# Patient Record
Sex: Female | Born: 1945 | Race: Black or African American | Hispanic: No | Smoking: Former smoker
Health system: Southern US, Community
[De-identification: ages and names within clinical notes are randomized; demographics above are authoritative.]

## PROBLEM LIST (undated history)

## (undated) DIAGNOSIS — E7800 Pure hypercholesterolemia, unspecified: Secondary | ICD-10-CM

## (undated) DIAGNOSIS — E119 Type 2 diabetes mellitus without complications: Secondary | ICD-10-CM

## (undated) DIAGNOSIS — I10 Essential (primary) hypertension: Secondary | ICD-10-CM

## (undated) DIAGNOSIS — H0520 Unspecified exophthalmos: Secondary | ICD-10-CM

## (undated) HISTORY — DX: Unspecified exophthalmos: H05.20

---

## 2003-01-05 ENCOUNTER — Encounter: Payer: Self-pay | Admitting: Emergency Medicine

## 2003-01-05 ENCOUNTER — Inpatient Hospital Stay (HOSPITAL_COMMUNITY): Admission: EM | Admit: 2003-01-05 | Discharge: 2003-01-10 | Payer: Self-pay | Admitting: Emergency Medicine

## 2003-01-06 ENCOUNTER — Encounter: Payer: Self-pay | Admitting: Internal Medicine

## 2003-01-07 ENCOUNTER — Encounter: Payer: Self-pay | Admitting: Internal Medicine

## 2016-06-10 DIAGNOSIS — I5031 Acute diastolic (congestive) heart failure: Secondary | ICD-10-CM | POA: Diagnosis present

## 2016-06-10 DIAGNOSIS — J449 Chronic obstructive pulmonary disease, unspecified: Secondary | ICD-10-CM | POA: Insufficient documentation

## 2016-06-11 DIAGNOSIS — D589 Hereditary hemolytic anemia, unspecified: Secondary | ICD-10-CM | POA: Diagnosis present

## 2016-06-11 DIAGNOSIS — J9611 Chronic respiratory failure with hypoxia: Secondary | ICD-10-CM | POA: Diagnosis present

## 2016-07-13 ENCOUNTER — Encounter (HOSPITAL_COMMUNITY): Payer: Self-pay

## 2016-07-13 ENCOUNTER — Emergency Department (HOSPITAL_COMMUNITY): Payer: Medicare Other

## 2016-07-13 ENCOUNTER — Inpatient Hospital Stay (HOSPITAL_COMMUNITY)
Admission: EM | Admit: 2016-07-13 | Discharge: 2016-07-16 | DRG: 377 | Disposition: A | Discharge status: Home / Self Care | Payer: Medicare Other | Attending: Internal Medicine | Admitting: Internal Medicine

## 2016-07-13 DIAGNOSIS — K922 Gastrointestinal hemorrhage, unspecified: Secondary | ICD-10-CM | POA: Diagnosis not present

## 2016-07-13 DIAGNOSIS — M341 CR(E)ST syndrome: Secondary | ICD-10-CM | POA: Diagnosis present

## 2016-07-13 DIAGNOSIS — J9611 Chronic respiratory failure with hypoxia: Secondary | ICD-10-CM | POA: Diagnosis present

## 2016-07-13 DIAGNOSIS — D589 Hereditary hemolytic anemia, unspecified: Secondary | ICD-10-CM | POA: Diagnosis present

## 2016-07-13 DIAGNOSIS — I5023 Acute on chronic systolic (congestive) heart failure: Secondary | ICD-10-CM

## 2016-07-13 DIAGNOSIS — I130 Hypertensive heart and chronic kidney disease with heart failure and stage 1 through stage 4 chronic kidney disease, or unspecified chronic kidney disease: Secondary | ICD-10-CM | POA: Diagnosis present

## 2016-07-13 DIAGNOSIS — N183 Chronic kidney disease, stage 3 (moderate): Secondary | ICD-10-CM | POA: Diagnosis present

## 2016-07-13 DIAGNOSIS — T39015A Adverse effect of aspirin, initial encounter: Secondary | ICD-10-CM | POA: Diagnosis present

## 2016-07-13 DIAGNOSIS — E119 Type 2 diabetes mellitus without complications: Secondary | ICD-10-CM

## 2016-07-13 DIAGNOSIS — K921 Melena: Principal | ICD-10-CM | POA: Diagnosis present

## 2016-07-13 DIAGNOSIS — E1122 Type 2 diabetes mellitus with diabetic chronic kidney disease: Secondary | ICD-10-CM | POA: Diagnosis present

## 2016-07-13 DIAGNOSIS — D649 Anemia, unspecified: Secondary | ICD-10-CM

## 2016-07-13 DIAGNOSIS — E669 Obesity, unspecified: Secondary | ICD-10-CM | POA: Diagnosis present

## 2016-07-13 DIAGNOSIS — Z87891 Personal history of nicotine dependence: Secondary | ICD-10-CM

## 2016-07-13 DIAGNOSIS — M329 Systemic lupus erythematosus, unspecified: Secondary | ICD-10-CM | POA: Diagnosis present

## 2016-07-13 DIAGNOSIS — J9621 Acute and chronic respiratory failure with hypoxia: Secondary | ICD-10-CM | POA: Diagnosis present

## 2016-07-13 DIAGNOSIS — I10 Essential (primary) hypertension: Secondary | ICD-10-CM | POA: Diagnosis present

## 2016-07-13 DIAGNOSIS — R768 Other specified abnormal immunological findings in serum: Secondary | ICD-10-CM | POA: Diagnosis present

## 2016-07-13 DIAGNOSIS — D539 Nutritional anemia, unspecified: Secondary | ICD-10-CM | POA: Diagnosis present

## 2016-07-13 DIAGNOSIS — K2970 Gastritis, unspecified, without bleeding: Secondary | ICD-10-CM | POA: Diagnosis present

## 2016-07-13 DIAGNOSIS — E872 Acidosis: Secondary | ICD-10-CM | POA: Diagnosis present

## 2016-07-13 DIAGNOSIS — I2721 Secondary pulmonary arterial hypertension: Secondary | ICD-10-CM | POA: Diagnosis present

## 2016-07-13 DIAGNOSIS — I5031 Acute diastolic (congestive) heart failure: Secondary | ICD-10-CM | POA: Diagnosis present

## 2016-07-13 DIAGNOSIS — I5082 Biventricular heart failure: Secondary | ICD-10-CM | POA: Diagnosis present

## 2016-07-13 DIAGNOSIS — Z6830 Body mass index (BMI) 30.0-30.9, adult: Secondary | ICD-10-CM

## 2016-07-13 DIAGNOSIS — I272 Pulmonary hypertension, unspecified: Secondary | ICD-10-CM

## 2016-07-13 DIAGNOSIS — I509 Heart failure, unspecified: Secondary | ICD-10-CM

## 2016-07-13 DIAGNOSIS — I50813 Acute on chronic right heart failure: Secondary | ICD-10-CM

## 2016-07-13 DIAGNOSIS — E7800 Pure hypercholesterolemia, unspecified: Secondary | ICD-10-CM | POA: Diagnosis present

## 2016-07-13 DIAGNOSIS — J449 Chronic obstructive pulmonary disease, unspecified: Secondary | ICD-10-CM | POA: Diagnosis present

## 2016-07-13 HISTORY — DX: Type 2 diabetes mellitus without complications: E11.9

## 2016-07-13 HISTORY — DX: Essential (primary) hypertension: I10

## 2016-07-13 HISTORY — DX: Pure hypercholesterolemia, unspecified: E78.00

## 2016-07-13 LAB — CBC WITH DIFFERENTIAL/PLATELET
BASOS ABS: 0 10*3/uL (ref 0.0–0.1)
BASOS PCT: 0 %
Eosinophils Absolute: 0.1 10*3/uL (ref 0.0–0.7)
Eosinophils Relative: 1 %
HEMATOCRIT: 21.6 % — AB (ref 36.0–46.0)
HEMOGLOBIN: 6.6 g/dL — AB (ref 12.0–15.0)
Lymphocytes Relative: 9 %
Lymphs Abs: 0.8 10*3/uL (ref 0.7–4.0)
MCH: 27.5 pg (ref 26.0–34.0)
MCHC: 30.6 g/dL (ref 30.0–36.0)
MCV: 90 fL (ref 78.0–100.0)
MONO ABS: 0.3 10*3/uL (ref 0.1–1.0)
Monocytes Relative: 3 %
NEUTROS ABS: 7.9 10*3/uL — AB (ref 1.7–7.7)
NEUTROS PCT: 87 %
Platelets: 307 10*3/uL (ref 150–400)
RBC: 2.4 MIL/uL — AB (ref 3.87–5.11)
RDW: 19.5 % — AB (ref 11.5–15.5)
WBC: 9.1 10*3/uL (ref 4.0–10.5)

## 2016-07-13 LAB — COMPREHENSIVE METABOLIC PANEL
ALBUMIN: 3.8 g/dL (ref 3.5–5.0)
ALT: 30 U/L (ref 14–54)
AST: 25 U/L (ref 15–41)
Alkaline Phosphatase: 56 U/L (ref 38–126)
Anion gap: 7 (ref 5–15)
BILIRUBIN TOTAL: 1.1 mg/dL (ref 0.3–1.2)
BUN: 14 mg/dL (ref 6–20)
CO2: 26 mmol/L (ref 22–32)
Calcium: 9.5 mg/dL (ref 8.9–10.3)
Chloride: 104 mmol/L (ref 101–111)
Creatinine, Ser: 1.13 mg/dL — ABNORMAL HIGH (ref 0.44–1.00)
GFR calc Af Amer: 56 mL/min — ABNORMAL LOW (ref >60)
GFR calc non Af Amer: 48 mL/min — ABNORMAL LOW (ref >60)
GLUCOSE: 163 mg/dL — AB (ref 65–99)
POTASSIUM: 4.2 mmol/L (ref 3.5–5.1)
Sodium: 137 mmol/L (ref 135–145)
TOTAL PROTEIN: 6.8 g/dL (ref 6.5–8.1)

## 2016-07-13 LAB — CBC
HCT: 29.6 % — ABNORMAL LOW (ref 36.0–46.0)
Hemoglobin: 9.5 g/dL — ABNORMAL LOW (ref 12.0–15.0)
MCH: 28.4 pg (ref 26.0–34.0)
MCHC: 32.1 g/dL (ref 30.0–36.0)
MCV: 88.6 fL (ref 78.0–100.0)
PLATELETS: 226 10*3/uL (ref 150–400)
RBC: 3.34 MIL/uL — AB (ref 3.87–5.11)
RDW: 18.4 % — ABNORMAL HIGH (ref 11.5–15.5)
WBC: 8.2 10*3/uL (ref 4.0–10.5)

## 2016-07-13 LAB — I-STAT ARTERIAL BLOOD GAS, ED
Bicarbonate: 27.1 mmol/L (ref 20.0–28.0)
O2 SAT: 95 %
TCO2: 29 mmol/L (ref 0–100)
pCO2 arterial: 55.6 mmHg — ABNORMAL HIGH (ref 32.0–48.0)
pH, Arterial: 7.295 — ABNORMAL LOW (ref 7.350–7.450)
pO2, Arterial: 86 mmHg (ref 83.0–108.0)

## 2016-07-13 LAB — I-STAT TROPONIN, ED: Troponin i, poc: 0.01 ng/mL (ref 0.00–0.08)

## 2016-07-13 LAB — GLUCOSE, CAPILLARY
Glucose-Capillary: 90 mg/dL (ref 65–99)
Glucose-Capillary: 88 mg/dL (ref 65–99)

## 2016-07-13 LAB — PREPARE RBC (CROSSMATCH)

## 2016-07-13 LAB — MRSA PCR SCREENING: MRSA by PCR: NEGATIVE

## 2016-07-13 LAB — BRAIN NATRIURETIC PEPTIDE: B Natriuretic Peptide: 971.3 pg/mL — ABNORMAL HIGH (ref 0.0–100.0)

## 2016-07-13 LAB — POC OCCULT BLOOD, ED: Fecal Occult Bld: POSITIVE — AB

## 2016-07-13 LAB — ABO/RH: ABO/RH(D): A POS

## 2016-07-13 MED ORDER — ORAL CARE MOUTH RINSE
15.0000 mL | Freq: Two times a day (BID) | OROMUCOSAL | Status: DC
  Administered 2016-07-14 – 2016-07-16 (×5): 15 mL via OROMUCOSAL

## 2016-07-13 MED ORDER — CHLORHEXIDINE GLUCONATE 0.12 % MT SOLN
15.0000 mL | Freq: Two times a day (BID) | OROMUCOSAL | Status: DC
  Administered 2016-07-13 – 2016-07-16 (×6): 15 mL via OROMUCOSAL
  Filled 2016-07-13 (×5): qty 15

## 2016-07-13 MED ORDER — AMLODIPINE BESYLATE 10 MG PO TABS
10.0000 mg | ORAL_TABLET | Freq: Every day | ORAL | Status: DC
  Administered 2016-07-14 – 2016-07-16 (×3): 10 mg via ORAL
  Filled 2016-07-13 (×3): qty 1

## 2016-07-13 MED ORDER — ACETAMINOPHEN 650 MG RE SUPP: 650.0000 mg | Freq: Four times a day (QID) | RECTAL | Status: DC | PRN

## 2016-07-13 MED ORDER — SODIUM CHLORIDE 0.9% FLUSH
3.0000 mL | Freq: Two times a day (BID) | INTRAVENOUS | Status: DC
  Administered 2016-07-14 – 2016-07-16 (×5): 3 mL via INTRAVENOUS

## 2016-07-13 MED ORDER — ATORVASTATIN CALCIUM 80 MG PO TABS
80.0000 mg | ORAL_TABLET | Freq: Every day | ORAL | Status: DC
  Administered 2016-07-14 – 2016-07-16 (×2): 80 mg via ORAL
  Filled 2016-07-13 (×2): qty 1

## 2016-07-13 MED ORDER — SODIUM CHLORIDE 0.9 % IV SOLN
Freq: Once | INTRAVENOUS | Status: AC
  Administered 2016-07-13: 10 mL/h via INTRAVENOUS

## 2016-07-13 MED ORDER — INSULIN ASPART 100 UNIT/ML ~~LOC~~ SOLN
0.0000 [IU] | Freq: Three times a day (TID) | SUBCUTANEOUS | Status: DC
  Administered 2016-07-14: 1 [IU] via SUBCUTANEOUS
  Administered 2016-07-14 – 2016-07-16 (×3): 2 [IU] via SUBCUTANEOUS

## 2016-07-13 MED ORDER — INSULIN ASPART 100 UNIT/ML ~~LOC~~ SOLN: 0.0000 [IU] | Freq: Every day | SUBCUTANEOUS | Status: DC

## 2016-07-13 MED ORDER — FUROSEMIDE 10 MG/ML IJ SOLN
20.0000 mg | Freq: Two times a day (BID) | INTRAMUSCULAR | Status: AC
  Administered 2016-07-13 – 2016-07-14 (×2): 20 mg via INTRAVENOUS
  Filled 2016-07-13 (×2): qty 2

## 2016-07-13 MED ORDER — FUROSEMIDE 10 MG/ML IJ SOLN
40.0000 mg | Freq: Once | INTRAMUSCULAR | Status: AC
  Administered 2016-07-13: 40 mg via INTRAVENOUS
  Filled 2016-07-13: qty 4

## 2016-07-13 MED ORDER — ACETAMINOPHEN 325 MG PO TABS: 650.0000 mg | ORAL_TABLET | Freq: Four times a day (QID) | ORAL | Status: DC | PRN

## 2016-07-13 MED ORDER — SENNOSIDES-DOCUSATE SODIUM 8.6-50 MG PO TABS: 1.0000 | ORAL_TABLET | Freq: Every evening | ORAL | Status: DC | PRN

## 2016-07-13 MED ORDER — SODIUM CHLORIDE 0.9 % IV SOLN
80.0000 mg | Freq: Once | INTRAVENOUS | Status: AC
  Administered 2016-07-13: 80 mg via INTRAVENOUS
  Filled 2016-07-13: qty 80

## 2016-07-13 MED ORDER — HYDRALAZINE HCL 20 MG/ML IJ SOLN: 5.0000 mg | Freq: Four times a day (QID) | INTRAMUSCULAR | Status: DC | PRN

## 2016-07-13 MED ORDER — SODIUM CHLORIDE 0.9 % IV SOLN
8.0000 mg/h | INTRAVENOUS | Status: DC
  Administered 2016-07-13 (×2): 8 mg/h via INTRAVENOUS
  Filled 2016-07-13 (×6): qty 80

## 2016-07-13 NOTE — ED Notes (Signed)
CRITICAL VALUE ALERT  Critical value received: Hgb 6.6  Date of notification:  07/13/2016  Time of notification:  0901  Critical value read back:Yes   Nurse who received alert: Earleen Newport RN  MD notified: Lita Mains MD

## 2016-07-13 NOTE — Progress Notes (Signed)
Pt transported from ED to 4N12 on 6 L [place]. Once patient settled in room RT placed back on BIPAP due to increased WOB. Pt seems to be breathing better on BIPAP and stated that it "feels so much better". Vital signs stable. Will cont to monitor.

## 2016-07-13 NOTE — ED Notes (Signed)
Hospitalist at bedside.

## 2016-07-13 NOTE — ED Notes (Signed)
RT at bedside placing applying Bipap.

## 2016-07-13 NOTE — ED Provider Notes (Signed)
[facility] DEPT Provider Note   CSN: [id number] Arrival date & time: 07/13/16  0755     History   Chief Complaint Chief Complaint  Patient presents with  . Shortness of Breath    HPI Kathleen Becker is a 70 y.o. female.  HPI Patient presents with increased shortness of breath starting last night. She's recently admitted for CHF exacerbation and discharged home with 2 L of supplement oxygen. She denies any pain at this point including chest pain. No new lower extremity swelling. No cough, fever or chills. Past Medical History:  Diagnosis Date  . Diabetes mellitus without complication ([place])   . High cholesterol   . Hypertension     Patient Active Problem List   Diagnosis Date Noted  . CHF exacerbation ([place]) 07/13/2016    History reviewed. No pertinent surgical history.  OB History    No data available       Home Medications    Prior to Admission medications   Not on File    Family History No family history on file.  Social History Social History  Substance Use Topics  . Smoking status: Never Smoker  . Smokeless tobacco: Not on file  . Alcohol use Not on file     Allergies   Patient has no allergy information on record.   Review of Systems Review of Systems  Constitutional: Negative for chills and fever.  Respiratory: Positive for shortness of breath. Negative for cough.   Cardiovascular: Negative for chest pain, palpitations and leg swelling.  Gastrointestinal: Negative for abdominal pain, diarrhea, nausea and vomiting.  Musculoskeletal: Negative for back pain, myalgias, neck pain and neck stiffness.  Skin: Negative for rash and wound.  Neurological: Negative for dizziness, weakness, light-headedness, numbness and headaches.  All other systems reviewed and are negative.    Physical Exam Updated Vital Signs BP 161/73   Pulse 75   Temp 98.8 F (37.1 C) (Axillary)   Resp 25   Ht _0  (1.6 m)   Wt 170 lb (77.1 kg)   SpO2 100%   BMI  30.11 kg/m   Physical Exam  Constitutional: She is oriented to person, place, and time. She appears well-developed and well-nourished.  HENT:  Head: Normocephalic and atraumatic.  Mouth/Throat: Oropharynx is clear and moist. No oropharyngeal exudate.  Eyes: EOM are normal. Pupils are equal, round, and reactive to light.  Neck: Normal range of motion. Neck supple.  Cardiovascular: Normal rate and regular rhythm.  Exam reveals no gallop and no friction rub.   No murmur heard. Pulmonary/Chest:  Diminished breath sounds bilateral bases. Patient with increased work of breathing. Speaking in few word sentences.  Abdominal: Soft. Bowel sounds are normal. There is no tenderness. There is no rebound and no guarding.  Musculoskeletal: Normal range of motion. She exhibits no edema or tenderness.  No lower extremity swelling, asymmetry or tenderness.  Neurological: She is alert and oriented to person, place, and time.  Moves all extremities without deficit. Sensation is fully intact.  Skin: Skin is warm and dry. Capillary refill takes less than 2 seconds. No rash noted. No erythema.  Psychiatric: She has a normal mood and affect. Her behavior is normal.  Nursing note and vitals reviewed.    ED Treatments / Results  Labs (all labs ordered are listed, but only abnormal results are displayed) Labs Reviewed  CBC WITH DIFFERENTIAL/PLATELET - Abnormal; Notable for the following:       Result Value   RBC 2.40 (*)  Hemoglobin 6.6 (*)    HCT 21.6 (*)    RDW 19.5 (*)    Neutro Abs 7.9 (*)    All other components within normal limits  COMPREHENSIVE METABOLIC PANEL - Abnormal; Notable for the following:    Glucose, Bld 163 (*)    Creatinine, Ser 1.13 (*)    GFR calc non Af Amer 48 (*)    GFR calc Af Amer 56 (*)    All other components within normal limits  BRAIN NATRIURETIC PEPTIDE - Abnormal; Notable for the following:    B Natriuretic Peptide 971.3 (*)    All other components within normal  limits  I-STAT ARTERIAL BLOOD GAS, ED - Abnormal; Notable for the following:    pH, Arterial 7.295 (*)    pCO2 arterial 55.6 (*)    All other components within normal limits  POC OCCULT BLOOD, ED - Abnormal; Notable for the following:    Fecal Occult Bld POSITIVE (*)    All other components within normal limits  I-STAT TROPOININ, ED  TYPE AND SCREEN  PREPARE RBC (CROSSMATCH)  ABO/RH    EKG  EKG Interpretation  Date/Time:  Tuesday July 13 2016 08:01:07 EST Ventricular Rate:  79 PR Interval:  134 QRS Duration: 64 QT Interval:  350 QTC Calculation: 401 R Axis:   51 Text Interpretation:  Normal sinus rhythm Normal ECG Confirmed by Lita Mains  MD, Davanta Meuser ([id number]) on 07/13/2016 11:37:52 AM       Radiology Dg Chest Port 1 View  Result Date: 07/13/2016 CLINICAL DATA:  Two days of shortness of breath EXAM: PORTABLE CHEST 1 VIEW COMPARISON:  Report of a chest x-ray of Jan 07, 2003 FINDINGS: The lungs are adequately inflated. There is bibasilar increased density. Pleural effusions are present greatest on the right. The cardiac silhouette is enlarged. The pulmonary vascularity is engorged. There is calcification in the wall of the aortic arch. The observed bony thorax is unremarkable. IMPRESSION: CHF with pulmonary interstitial edema and bilateral pleural effusions greatest on the right. Bibasilar atelectasis or pneumonia is suspected as well. Thoracic aortic atherosclerosis. Electronically Signed   By: Railyn House  [place] M.D.   On: 07/13/2016 09:33    Procedures Procedures (including critical care time)  Medications Ordered in ED Medications  pantoprazole (PROTONIX) 80 mg in sodium chloride 0.9 % 100 mL IVPB (not administered)  pantoprazole (PROTONIX) 80 mg in sodium chloride 0.9 % 250 mL (0.32 mg/mL) infusion (not administered)  0.9 %  sodium chloride infusion (10 mL/hr Intravenous New Bag/Given 07/13/16 1042)  furosemide (LASIX) injection 40 mg (40 mg Intravenous Given 07/13/16 1029)     CRITICAL CARE Performed by: Lita Mains, Pessy Delamar Total critical care time: 35 minutes Critical care time was exclusive of separately billable procedures and treating other patients. Critical care was necessary to treat or prevent imminent or life-threatening deterioration. Critical care was time spent personally by me on the following activities: development of treatment plan with patient and/or surrogate as well as nursing, discussions with consultants, evaluation of patient's response to treatment, examination of patient, obtaining history from patient or surrogate, ordering and performing treatments and interventions, ordering and review of laboratory studies, ordering and review of radiographic studies, pulse oximetry and re-evaluation of patient's condition.  Initial Impression / Assessment and Plan / ED Course  I have reviewed the triage vital signs and the nursing notes.  Pertinent labs & imaging results that were available during my care of the patient were reviewed by me and considered in my medical decision making (  see chart for details).  Clinical Course    Patient was placed on BiPAP with improved oxygenation. X-ray with bilateral effusions and congestion. Question atelectasis versus infiltrate. Patient is afebrile with a normal white count. Low suspicion for pneumonia. Also noted to be anemic with hemoglobin of 6.6. Admits to melanotic stools recently. Guaiac-positive. Started on Protonix. Discussed with gastroenterology who will see patient in consult. Internal medicine teaching service will admit to step down bed.   Final Clinical Impressions(s) / ED Diagnoses   Final diagnoses:  Acute on chronic congestive heart failure, unspecified congestive heart failure type (HCC)  Symptomatic anemia  Acute gastrointestinal bleeding    New Prescriptions New Prescriptions   No medications on file     Julianne Rice, MD 07/13/16 1138

## 2016-07-13 NOTE — Consult Note (Signed)
Referring Provider:  Dr. Johnnette Gourd (Med Teaching Service) Primary Care Physician:  No primary care provider on file. Primary Gastroenterologist:  None (unassigned--was seen for colonoscopy many years ago by unknown practitioner)  Reason for Consultation:  Heme positive stool and anemia  HPI: Kathleen Becker is a 70 y.o. female been admitted through the emergency room because of acute on chronic shortness of breath.  She is 3 weeks status post hospitalization at [facility] for similar symptoms, with a perhaps 1 month prodrome of significant exertional dyspnea, in association with a chronic, nonproductive cough, as well as leg swelling. The patient has a minimal smoking history, just a couple of cigarettes a day, and in fact stopped that several years ago.   Although the patient was initially thought to have congestive heart failure, apparently that diagnosis was ruled out (records from that hospitalization are not currently available for review). Apparently she even had a cardiac catheterization, as well as echocardiograms.   It was felt, according to the patient and her daughter, that her problem is most likely a primary pulmonary condition and an outpatient pulmonologist referral was made, but she has not yet seen that physician. She was sent home on home oxygen around the clock, 2 L/m, and her breathing improved to the point where she was quite comfortable and even able to walk moderate distances without shortness of breath.  However, last night and this morning, she was acutely short of breath and was brought to the hospital, where she was found to be severely anemic with a hemoglobin of 6.6 (MCV 90, RDW elevated at 19.5, BUN normal). Her hemoglobin during her recent hospitalization in [place] is not known, but neither the patient nor her daughter were told of any anemia being present during that hospitalization.   In the ER here, she was occultly heme positive and her stool was  dark in character, according to the emergency room physician. The patient herself noted dark, melenic stools about a month ago but did not notice any recently.   She does not have significant localizing GI tract symptoms although she says for the past year or so, her appetite has been diminished and she's had perhaps a 15 pound weight loss, unintentional.  The patient specifically denies dysphagia, nausea, reflux symptomatology, epigastric pain, constipation, diarrhea, or overt rectal bleeding.  She has been on 81 mg aspirin daily for many years, without PPI coverage. She has no prior history of GI bleeding.  She indicates that approximately 2004, she underwent upper endoscopy and colonoscopy at [facility], apparently because of heme-positive stool and anemia at that time, apparently without any significant findings being uncovered. She does not remember who did that exam.   Past Medical History:  Diagnosis Date  . Diabetes mellitus without complication ([place])   . High cholesterol   . Hypertension     History reviewed. No pertinent surgical history.  Prior to Admission medications   Not on File    Current Facility-Administered Medications  Medication Dose Route Frequency Provider Last Rate Last Dose  . acetaminophen (TYLENOL) tablet 650 mg  650 mg Oral Q6H PRN Milagros Loll, MD       Or  . acetaminophen (TYLENOL) suppository 650 mg  650 mg Rectal Q6H PRN Milagros Loll, MD      . amLODipine (NORVASC) tablet 10 mg  10 mg Oral Daily Milagros Loll, MD      . atorvastatin (LIPITOR) tablet 80 mg  80 mg Oral q1800  Milagros Loll, MD      . furosemide (LASIX) injection 20 mg  20 mg Intravenous BID Milagros Loll, MD      . insulin aspart (novoLOG) injection 0-5 Units  0-5 Units Subcutaneous QHS Milagros Loll, MD      . insulin aspart (novoLOG) injection 0-9 Units  0-9 Units Subcutaneous TID WC Milagros Loll, MD      . pantoprazole (PROTONIX) 80 mg in sodium  chloride 0.9 % 250 mL (0.32 mg/mL) infusion  8 mg/hr Intravenous Continuous Julianne Rice, MD 25 mL/hr at 07/13/16 1324 8 mg/hr at 07/13/16 1324  . senna-docusate (Senokot-S) tablet 1 tablet  1 tablet Oral QHS PRN Milagros Loll, MD      . sodium chloride flush (NS) 0.9 % injection 3 mL  3 mL Intravenous Q12H Milagros Loll, MD       No current outpatient prescriptions on file.    Allergies as of 07/13/2016  . (Not on File)    No family history on file.  Social History   Social History  . Marital status: Divorced    Spouse name: N/A  . Number of children: N/A  . Years of education: N/A   Occupational History  . Not on file.   Social History Main Topics  . Smoking status: Never Smoker  . Smokeless tobacco: Not on file  . Alcohol use Not on file  . Drug use: Unknown  . Sexual activity: Not on file   Other Topics Concern  . Not on file   Social History Narrative  . No narrative on file    Review of Systems:   Recent dry cough and shortness of breath as per history of present illness. No chest pain. Abdominal and GI symptoms as per history of present illness. No neurologic symptomatology such as paresthesias or localized weakness. No hematuria. No joint swelling or effusions. No enlargement of lymph nodes. No skin rashes.  Physical Exam: Vital signs in last 24 hours: Temp:  [97.5 F (36.4 C)-98.8 F (37.1 C)] 98.8 F (37.1 C) (11/14 1126) Pulse Rate:  [68-80] 71 (11/14 1400) Resp:  [17-29] 25 (11/14 1400) BP: (135-176)/(54-88) 155/88 (11/14 1400) SpO2:  [73 %-100 %] 100 % (11/14 1400) Weight:  [77.1 kg (170 lb)] 77.1 kg (170 lb) (11/14 0802)   General:   Alert,  Well-developed, well-nourished, pleasant and cooperative in NAD, on BiPAP device Head:  Normocephalic and atraumatic. Eyes:  Sclera clear, no icterus.   Conjunctiva pale, s/p 1 u prc's. Mouth:   No ulcerations or lesions.   Neck:   No masses or thyromegaly. Lungs:  No rales or wheezes. Perhaps a  few soft rhonchi. Vesicular breath sounds seem to be somewhat decreased. There is no overt prolongation of expiration. Heart:   Regular rate and rhythm; no murmurs, clicks, rubs,  or gallops. Abdomen:  Soft, nontender, nontympanitic, and nondistended although somewhat opiates. No masses, hepatosplenomegaly or ventral hernias noted.Quiet bowel sounds, without bruits, guarding, or rebound.   Rectal:  Per EDP, showed dark heme-positive stool Msk:   Symmetrical without gross deformities. Pulses:  Normal radial pulse is noted. Extremities:   Without clubbing, cyanosis, or edema. Specifically, no pitting tibial edema at this time. Neurologic:  Alert and coherent;  grossly normal neurologically. Skin:  Intact without significant lesions or rashes. Cervical Nodes:  No significant cervical adenopathy. Psych:   Alert and cooperative. Normal mood and affect.  Intake/Output from previous day: No intake/output data recorded. Intake/Output this  shift: Total I/O In: -  Out: 450 [Urine:450]  Lab Results:  Recent Labs  07/13/16 0829  WBC 9.1  HGB 6.6*  HCT 21.6*  PLT 307   BMET  Recent Labs  07/13/16 0829  NA 137  K 4.2  CL 104  CO2 26  GLUCOSE 163*  BUN 14  CREATININE 1.13*  CALCIUM 9.5   LFT  Recent Labs  07/13/16 0829  PROT 6.8  ALBUMIN 3.8  AST 25  ALT 30  ALKPHOS 56  BILITOT 1.1   PT/INR No results for input(s): LABPROT, INR in the last 72 hours.  Studies/Results: Dg Chest Port 1 View  Result Date: 07/13/2016 CLINICAL DATA:  Two days of shortness of breath EXAM: PORTABLE CHEST 1 VIEW COMPARISON:  Report of a chest x-ray of Jan 07, 2003 FINDINGS: The lungs are adequately inflated. There is bibasilar increased density. Pleural effusions are present greatest on the right. The cardiac silhouette is enlarged. The pulmonary vascularity is engorged. There is calcification in the wall of the aortic arch. The observed bony thorax is unremarkable. IMPRESSION: CHF with  pulmonary interstitial edema and bilateral pleural effusions greatest on the right. Bibasilar atelectasis or pneumonia is suspected as well. Thoracic aortic atherosclerosis. Electronically Signed   By: David  [place] M.D.   On: 07/13/2016 09:33    Impression: 1. Anemia, normocytic,? Subacute 2. Heme positive stool, dark stool, recent melenic stool per patient 3. Aspirin exposure, without PPI coverage 4. Recent hospitalization for shortness of breath and leg swelling, with apparent suspicion of pulmonary causation, workup pending. Apparently no history of heart failure based on cardiac catheterization and echo at outside hospital.  5. Remote history (2004) of endoscopy and colonoscopy, results unavailable, apparently no major findings 6. Medical problems including diabetes, hypertension, dyslipidemia  Plan: Patient will definitely need endoscopy, colonoscopy, and possibly capsule endoscopy, ideally on this admission given the instability with which she presented. I have discussed the nature, purpose, risks, and alternatives of endoscopy and colonoscopy with the patient and her daughter, Hilda Blades, at the bedside.  However, the patient's pulmonary status is not currently sufficient to do nonemergent endoscopic/colonoscopic evaluation. Following blood transfusion, with improvement in her hemoglobin, this may improve to the point where she will be ready to have the examinations, but I will defer to Pulmonary on that issue.   I would favor pulmonary consultation during this hospitalization to assist with patient's evaluation and to help guide optimal timing of endoscopic and colonoscopic testing.  We will plan to follow with you, but in the meantime, do not hesitate to call if you would like to discuss her case.   LOS: 0 days   Emeri Estill V  07/13/2016, 2:30 PM   Pager [phone] If no answer or after 5 PM call [phone]

## 2016-07-13 NOTE — Progress Notes (Signed)
Pt placed on BIPAP per MD order. Pt tol very well. Seems to be breathing easier. Vital signs stable. Will cont to monitor

## 2016-07-13 NOTE — ED Notes (Signed)
Respiratory called to put pt on Bipap, per [facility].

## 2016-07-13 NOTE — H&P (Signed)
Date: 07/13/2016               Patient Name:  Kathleen Becker MRN: [id number]  DOB: July 07, 1946 Age / Sex: 70 y.o., female   PCP: No primary care provider on file.         Medical Service: Internal Medicine Teaching Service         Attending Physician: Dr. Lucious Groves, DO    First Contact: Dr. Jari Favre Pager: [phone]  Second Contact: Dr. Tiburcio Pea Pager: [phone]       After Hours (After 5p/  First Contact Pager: [phone]  weekends / holidays): Second Contact Pager: [phone]   Chief Complaint: shortness of breath  History of Present Illness: 70 year old woman with hsitory of DM2, HTN, HLD, severe pulmonary HTN presenting with dyspnea x 1 day. She had some mild dyspnea the day before yesterday which resolved after she used her incentive spirometer. Last night she was lying in bed when she became acute dyspneic. She tried to sit up with no relief. She felt a little bit better after she drank some cold water. She denies chest pain. She has mild LE edema. No fevers, chills, cough.  She has noted that she has been having melena. She denies nausea, vomiting, abdominal pain, constipation, diarrhea, hematochezia. She has not had any unintentional weight loss but does note decreased appetite. She denies dysphagia or odynophagia. Denies dysuria or hematuria. She is on ASA 22m daily otherwise no NSAIDs. No blood thinners.  Recently admitted 10/12 with dyspnea. LHC on 10/19 with no cardiac disease noted. She has severe pulmonary hypertension with PA pressure of 70. She was discharged on 10/20 with 2L supplemental oxygen.  She notes that she was admitted 7 years ago for pneumonia and had melena. She had EGD and colonoscopy done at the time. The only finding was a single polyp which was benign according to patient.  Meds:  No outpatient prescriptions have been marked as taking [facility]     Allergies: Allergies as of 07/13/2016  . (Not on File)   Past  Medical History:  Diagnosis Date  . Diabetes mellitus without complication ([place]   . High cholesterol   . Hypertension     Family History:  Mother had renal disease Sister had uterine cancer  Social History:  She quit tobacco 5 years ago and smoked 1-2 cigarettes a day for 30 years. Occasional alcohol use Denies illicits.  Review of Systems: A complete ROS was negative except as per HPI.   Physical Exam: Blood pressure 161/73, pulse 75, temperature 98.8 F (37.1 C), temperature source Axillary, resp. rate 25, height _0  (1.6 m), weight 170 lb (77.1 kg), SpO2 100 %. General Apperance: NAD Head: Normocephalic, atraumatic Eyes: PERRL, EOMI, anicteric sclera Ears: Normal external ear canal Nose: Nares normal, septum midline, mucosa normal Throat: Lips, mucosa and tongue normal  Neck: Supple, trachea midline Back: No tenderness or bony abnormality  Lungs: Clear to auscultation bilaterally. No wheezes, rhonchi or rales. Breathing comfortably Chest Wall: Nontender, no deformity Heart: Regular rate and rhythm, no murmur/rub/gallop Abdomen: Soft, nontender, nondistended, no rebound/guarding Extremities: Normal, atraumatic, warm and well perfused, no edema Pulses: 2+ throughout Skin: No rashes or lesions Neurologic: Alert and oriented x 3. CNII-XII intact. Normal strength and sensation  EKG: normal sinus rhythm, T wave inversion in III, no previous EKG for comparison  CXR: Enlarged cardiac silhouette with pulmonary interstitial edema and bilateral pleural effusions greatest on the right.  Assessment & Plan by Problem: 70 year old woman with history of DM2, HTN, HLD, severe pulmonary HTN presenting with dyspnea.  Acute on Chronic Right Ventricular Failure, Severe Pulmonary Hypertension: Dyspnea x 1 day. Recently diagnosed with severe pulmonary HTN on cath. BNP 971.3 today. BNP on discharge from previous hospitalization 157. Echo on 10/16 with LV EF 07-86%, diastolic dysfunction.  EKG with no acute ischemic changes and troponin 0.01. CXR with pulmonary interstitial edema and bilateral pleural effusions. ABG with respiratory acidosis with pH 7.295 and pCO2 55.6. Last ABG on 10/12 with pH 7.3 and pCO2 53. D-dimer on 10/12 657. VQ scan on 10/16 with low probability for PE. She was given 17m IV Lasix and placed on BiPAP in the ED. -Admit to step down unit -Wean off BiPAP -Strict in/out, daily weight -Continue diuresis. Given her pulmonary hypertension will continue with gentle diuresis as she is preload dependent. Lasix 250mBID for now.  Acute on Chronic Anemia: Hgb on admission 6.6 Last hgb 9.2 on 10/19. She is FOBT positive. Last endoscopy was over 7 years ago. She was started on protonix gtt and given 2 units pRBC. GI consulted by ED. -Await GI recs -Consider changing PPI gtt to pantoprazole 4043mID depending on GI recs -NPO -Hold aspirin. She is on this for primary prevention as her cath recently had no coronary artery disease. -Repeat CBC at 8PM  CKD3: Creatinine 1.13. Last cr 1.25 on 10/19. GFR 50.4.   DM2: Hgb A1c 6 on 8/31 -Hold home metformin -SSI  HTN: Continue home amlodipine 71m80mily  HLD: Continue home Lipitor 80mg24mly.  FEN: NPO VTE ppx: SCDs Code status: FULL  Dispo: Admit patient to Observation with expected length of stay less than 2 midnights.  Signed: JenniMilagros Loll11/14/2017, 11:46 AM  Pager: [phone]

## 2016-07-13 NOTE — ED Triage Notes (Signed)
Patient complains of increasing shortness of breath x 1 day with mild cough. Denies CP. Was recently hospitalized for same and no diagnosis made. Oxygen sats 73 on 2l on arrival. Alert and oriented, denies pain

## 2016-07-13 NOTE — Progress Notes (Signed)
Paged Internal Med about BP, orders to give hydralazine for Syst>200, will continue to monitor closely.

## 2016-07-14 ENCOUNTER — Encounter (HOSPITAL_COMMUNITY): Payer: Self-pay | Admitting: *Deleted

## 2016-07-14 DIAGNOSIS — J9621 Acute and chronic respiratory failure with hypoxia: Secondary | ICD-10-CM | POA: Diagnosis not present

## 2016-07-14 DIAGNOSIS — I5033 Acute on chronic diastolic (congestive) heart failure: Secondary | ICD-10-CM | POA: Diagnosis not present

## 2016-07-14 DIAGNOSIS — I10 Essential (primary) hypertension: Secondary | ICD-10-CM | POA: Diagnosis present

## 2016-07-14 DIAGNOSIS — E1122 Type 2 diabetes mellitus with diabetic chronic kidney disease: Secondary | ICD-10-CM

## 2016-07-14 DIAGNOSIS — M329 Systemic lupus erythematosus, unspecified: Secondary | ICD-10-CM | POA: Diagnosis present

## 2016-07-14 DIAGNOSIS — Z7982 Long term (current) use of aspirin: Secondary | ICD-10-CM

## 2016-07-14 DIAGNOSIS — T39015A Adverse effect of aspirin, initial encounter: Secondary | ICD-10-CM | POA: Diagnosis present

## 2016-07-14 DIAGNOSIS — E872 Acidosis: Secondary | ICD-10-CM | POA: Diagnosis present

## 2016-07-14 DIAGNOSIS — I272 Pulmonary hypertension, unspecified: Secondary | ICD-10-CM | POA: Diagnosis not present

## 2016-07-14 DIAGNOSIS — Z789 Other specified health status: Secondary | ICD-10-CM | POA: Insufficient documentation

## 2016-07-14 DIAGNOSIS — I2729 Other secondary pulmonary hypertension: Secondary | ICD-10-CM | POA: Diagnosis not present

## 2016-07-14 DIAGNOSIS — E785 Hyperlipidemia, unspecified: Secondary | ICD-10-CM

## 2016-07-14 DIAGNOSIS — J449 Chronic obstructive pulmonary disease, unspecified: Secondary | ICD-10-CM | POA: Diagnosis present

## 2016-07-14 DIAGNOSIS — D539 Nutritional anemia, unspecified: Secondary | ICD-10-CM | POA: Diagnosis present

## 2016-07-14 DIAGNOSIS — Z79899 Other long term (current) drug therapy: Secondary | ICD-10-CM

## 2016-07-14 DIAGNOSIS — H409 Unspecified glaucoma: Secondary | ICD-10-CM | POA: Insufficient documentation

## 2016-07-14 DIAGNOSIS — E119 Type 2 diabetes mellitus without complications: Secondary | ICD-10-CM

## 2016-07-14 DIAGNOSIS — Z6830 Body mass index (BMI) 30.0-30.9, adult: Secondary | ICD-10-CM

## 2016-07-14 DIAGNOSIS — D588 Other specified hereditary hemolytic anemias: Secondary | ICD-10-CM | POA: Diagnosis not present

## 2016-07-14 DIAGNOSIS — E7800 Pure hypercholesterolemia, unspecified: Secondary | ICD-10-CM | POA: Diagnosis present

## 2016-07-14 DIAGNOSIS — I130 Hypertensive heart and chronic kidney disease with heart failure and stage 1 through stage 4 chronic kidney disease, or unspecified chronic kidney disease: Secondary | ICD-10-CM | POA: Diagnosis not present

## 2016-07-14 DIAGNOSIS — N183 Chronic kidney disease, stage 3 (moderate): Secondary | ICD-10-CM | POA: Diagnosis present

## 2016-07-14 DIAGNOSIS — E669 Obesity, unspecified: Secondary | ICD-10-CM | POA: Diagnosis present

## 2016-07-14 DIAGNOSIS — K2970 Gastritis, unspecified, without bleeding: Secondary | ICD-10-CM | POA: Diagnosis present

## 2016-07-14 DIAGNOSIS — I2721 Secondary pulmonary arterial hypertension: Secondary | ICD-10-CM | POA: Diagnosis present

## 2016-07-14 DIAGNOSIS — Z7984 Long term (current) use of oral hypoglycemic drugs: Secondary | ICD-10-CM

## 2016-07-14 DIAGNOSIS — Z86010 Personal history of colonic polyps: Secondary | ICD-10-CM

## 2016-07-14 DIAGNOSIS — Z841 Family history of disorders of kidney and ureter: Secondary | ICD-10-CM

## 2016-07-14 DIAGNOSIS — M341 CR(E)ST syndrome: Secondary | ICD-10-CM | POA: Diagnosis present

## 2016-07-14 DIAGNOSIS — Z87891 Personal history of nicotine dependence: Secondary | ICD-10-CM | POA: Diagnosis not present

## 2016-07-14 DIAGNOSIS — D589 Hereditary hemolytic anemia, unspecified: Secondary | ICD-10-CM | POA: Diagnosis present

## 2016-07-14 DIAGNOSIS — I5082 Biventricular heart failure: Secondary | ICD-10-CM | POA: Diagnosis present

## 2016-07-14 DIAGNOSIS — I5031 Acute diastolic (congestive) heart failure: Secondary | ICD-10-CM | POA: Diagnosis not present

## 2016-07-14 DIAGNOSIS — D649 Anemia, unspecified: Secondary | ICD-10-CM

## 2016-07-14 DIAGNOSIS — K921 Melena: Secondary | ICD-10-CM | POA: Diagnosis present

## 2016-07-14 DIAGNOSIS — R768 Other specified abnormal immunological findings in serum: Secondary | ICD-10-CM | POA: Diagnosis present

## 2016-07-14 DIAGNOSIS — E782 Mixed hyperlipidemia: Secondary | ICD-10-CM | POA: Insufficient documentation

## 2016-07-14 DIAGNOSIS — Z8049 Family history of malignant neoplasm of other genital organs: Secondary | ICD-10-CM

## 2016-07-14 DIAGNOSIS — K922 Gastrointestinal hemorrhage, unspecified: Secondary | ICD-10-CM | POA: Diagnosis present

## 2016-07-14 LAB — CBC
HCT: 29.4 % — ABNORMAL LOW (ref 36.0–46.0)
HEMOGLOBIN: 9.2 g/dL — AB (ref 12.0–15.0)
MCH: 27.8 pg (ref 26.0–34.0)
MCHC: 31.3 g/dL (ref 30.0–36.0)
MCV: 88.8 fL (ref 78.0–100.0)
Platelets: 222 10*3/uL (ref 150–400)
RBC: 3.31 MIL/uL — ABNORMAL LOW (ref 3.87–5.11)
RDW: 18.4 % — AB (ref 11.5–15.5)
WBC: 8.4 10*3/uL (ref 4.0–10.5)

## 2016-07-14 LAB — TECHNOLOGIST SMEAR REVIEW

## 2016-07-14 LAB — BASIC METABOLIC PANEL
ANION GAP: 7 (ref 5–15)
BUN: 14 mg/dL (ref 6–20)
CALCIUM: 9 mg/dL (ref 8.9–10.3)
CO2: 29 mmol/L (ref 22–32)
Chloride: 104 mmol/L (ref 101–111)
Creatinine, Ser: 1.26 mg/dL — ABNORMAL HIGH (ref 0.44–1.00)
GFR calc Af Amer: 49 mL/min — ABNORMAL LOW (ref >60)
GFR, EST NON AFRICAN AMERICAN: 42 mL/min — AB (ref >60)
GLUCOSE: 144 mg/dL — AB (ref 65–99)
POTASSIUM: 3.6 mmol/L (ref 3.5–5.1)
SODIUM: 140 mmol/L (ref 135–145)

## 2016-07-14 LAB — PULMONARY FUNCTION TEST
FEF 25-75 POST: 0.63 L/s
FEF 25-75 Pre: 0.3 L/sec
FEF2575-%CHANGE-POST: 109 %
FEF2575-%PRED-POST: 39 %
FEF2575-%Pred-Pre: 18 %
FEV1-%CHANGE-POST: 23 %
FEV1-%Pred-Post: 37 %
FEV1-%Pred-Pre: 30 %
FEV1-POST: 0.64 L
FEV1-Pre: 0.52 L
FEV1FVC-%CHANGE-POST: 11 %
FEV1FVC-%PRED-PRE: 87 %
FEV6-%Change-Post: 10 %
FEV6-%PRED-POST: 40 %
FEV6-%Pred-Pre: 36 %
FEV6-Post: 0.86 L
FEV6-Pre: 0.77 L
FEV6FVC-%CHANGE-POST: 0 %
FEV6FVC-%Pred-Post: 104 %
FEV6FVC-%Pred-Pre: 104 %
FVC-%Change-Post: 10 %
FVC-%PRED-POST: 38 %
FVC-%PRED-PRE: 34 %
FVC-PRE: 0.77 L
FVC-Post: 0.86 L
POST FEV1/FVC RATIO: 75 %
PRE FEV6/FVC RATIO: 100 %
Post FEV6/FVC ratio: 100 %
Pre FEV1/FVC ratio: 67 %

## 2016-07-14 LAB — DIRECT ANTIGLOBULIN TEST (NOT AT ARMC)
DAT, COMPLEMENT: NEGATIVE
DAT, IgG: NEGATIVE

## 2016-07-14 LAB — TYPE AND SCREEN
ABO/RH(D): A POS
ANTIBODY SCREEN: NEGATIVE
UNIT DIVISION: 0
UNIT DIVISION: 0

## 2016-07-14 LAB — LACTATE DEHYDROGENASE: LDH: 238 U/L — AB (ref 98–192)

## 2016-07-14 LAB — GLUCOSE, CAPILLARY
GLUCOSE-CAPILLARY: 98 mg/dL (ref 65–99)
GLUCOSE-CAPILLARY: 206 mg/dL — AB (ref 65–99)
Glucose-Capillary: 176 mg/dL — ABNORMAL HIGH (ref 65–99)
Glucose-Capillary: 136 mg/dL — ABNORMAL HIGH (ref 65–99)
Glucose-Capillary: 102 mg/dL — ABNORMAL HIGH (ref 65–99)

## 2016-07-14 LAB — RETICULOCYTES
RBC.: 3.45 MIL/uL — ABNORMAL LOW (ref 3.87–5.11)
RETIC COUNT ABSOLUTE: 138 10*3/uL (ref 19.0–186.0)
RETIC CT PCT: 4 % — AB (ref 0.4–3.1)

## 2016-07-14 MED ORDER — PANTOPRAZOLE SODIUM 40 MG PO TBEC
40.0000 mg | DELAYED_RELEASE_TABLET | Freq: Two times a day (BID) | ORAL | Status: DC
  Administered 2016-07-14 – 2016-07-16 (×6): 40 mg via ORAL
  Filled 2016-07-14 (×6): qty 1

## 2016-07-14 MED ORDER — FUROSEMIDE 10 MG/ML IJ SOLN
20.0000 mg | Freq: Two times a day (BID) | INTRAMUSCULAR | Status: AC
  Administered 2016-07-14 – 2016-07-15 (×2): 20 mg via INTRAVENOUS
  Filled 2016-07-14 (×2): qty 2

## 2016-07-14 MED ORDER — SUCRALFATE 1 GM/10ML PO SUSP
1.0000 g | Freq: Three times a day (TID) | ORAL | Status: DC
  Administered 2016-07-14 – 2016-07-16 (×9): 1 g via ORAL
  Filled 2016-07-14 (×11): qty 10

## 2016-07-14 MED ORDER — DORZOLAMIDE HCL-TIMOLOL MAL 2-0.5 % OP SOLN
1.0000 [drp] | Freq: Two times a day (BID) | OPHTHALMIC | Status: DC
  Administered 2016-07-15 – 2016-07-16 (×2): 1 [drp] via OPHTHALMIC
  Filled 2016-07-14 (×2): qty 10

## 2016-07-14 MED ORDER — ATENOLOL 50 MG PO TABS
50.0000 mg | ORAL_TABLET | Freq: Every day | ORAL | Status: DC
  Administered 2016-07-14 – 2016-07-16 (×3): 50 mg via ORAL
  Filled 2016-07-14 (×3): qty 1

## 2016-07-14 NOTE — Progress Notes (Signed)
Off BiPAP. On nasal O2. Breathing comfortably, no evident distress.  Hemoglobin 9.2 following transfusion, stable overnight. BUN remains normal, implying absence of active upper tract bleeding.  Patient is off aspirin and on pantoprazole twice a day, plus sucralfate 4 times a day.  Dr. Agustina Caroli note (pulmonary medicine) reviewed, and case discussed with Dr. Jari Favre of the internal medicine house staff.  Although I was unaware of that when I did my consultation yesterday, it is now noted that this patient has pulmonary hypertension, as confirmed by very high pulmonary arterial pressures on cardiac catheterization last month in [place]. In addition, there may be an element of significant intrinsic lung disease, which is in the process of being evaluated.  Recommendations:  1. In light of the above information, I now feel (in contrast to my initial consult note yesterday) that this patient would be best served by holding off on endoscopy and colonoscopy, at least for the time being. We will wait and see what the pulmonary function testing and the remainder of the pulmonary workup shows [place].  2. Reasoning for holding off includes the increased anesthesia risk, coupled with the relatively poor tolerance that smokers often have for instrumentation of the upper GI tract. Moreover, the patient is not showing signs of active bleeding at present, so our hand is not forced to do endoscopy.  3. Instead, I would favor aggressive antipeptic therapy as is currently being done, and monitoring the patient for signs of progressive blood loss.  4. Specifically, I would recommend twice daily Protonix indefinitely, and sucralfate 4 times a day for at least the next week.  5. I would also begin iron supplementation, keeping in mind that that will probably give her dark stools.  6. Concerning aspirin, it would be ideal if the patient could remain off it for at least the next week, to allow any ulcer that may be  present to have a chance to begin to heal. Thereafter, if aspirin is felt to be strongly indicated, it would probably be okay to resume it as long as the patient had ongoing PPI prophylaxis.  7. The patient will need fairly frequent, close follow-up after discharge, with monitoring of her hemoglobin level, and ideally Hemoccult testing as well.  This could be accomplished through her primary care physician's office. Since she has recently (? Temporarily?) Relocated to this area, she may need or want to establish with the PCP here in [place].  8. As long as the patient remains stable from the GI bleeding standpoint, my intent would be to see her in my office about a month from now. Depending on what her hemoglobin is doing in response to iron supplementation, it might be sufficient simply to do serial Hemoccults, with no further GI testing if she is repeatedly Hemoccult negative and her hemoglobin improves appropriately.   9. On the other hand, if she remains Hemoccult positive and/or her hemoglobin fails to improve with iron supplementation, she might need evaluation of the GI tract, which could probably be accomplished most safely by radiographic means (upper GI series and barium enema).  10. If the patient shows evidence of active or subacute bleeding, as defined by fairly rapid drops in her hemoglobin, I would probably attempt outpatient upper endoscopy with minimal conscious sedation using a pediatric endoscope at the hospital endoscopy unit.  11. We will follow at a distance during the remainder of her hospitalization. In the meantime, please feel free to call me if earlier input is needed, or if you  have any questions, or would like to discuss her case.  Cleotis Nipper, M.D. Pager [phone] If no answer or after 5 PM call [phone]

## 2016-07-14 NOTE — Progress Notes (Signed)
Subjective:  Patient states she is doing much better today though still gets short of breath with even short-term ambulation. Otherwise, denies chest pain, cough, abd pain, or further stools. Denies urinary symptoms including hematuria.  Objective:  Vital signs in last 24 hours: Vitals:   07/14/16 0600 07/14/16 0800 07/14/16 1000 07/14/16 1220  BP: (!) 157/74 (!) 171/71 (!) 166/68 (!) 142/60  Pulse: 74 82 79   Resp: (!) 32 (!) 21 (!) 25   Temp:  98.6 F (37 C)  99.3 F (37.4 C)  TempSrc:  Oral  Oral  SpO2: 94% 98% 90%   Weight: 168 lb 6.9 oz (76.4 kg)     Height:       Constitutional: NAD, sitting up comfortably in bed, VS reviewed CV: RRR, systolic murmur best heard on RUSB, no rubs or gallops appreciated Resp: mild bibasilar crackles bilaterally, no wheezing or increased work of breathing Abd: soft, NDNT, +BS MSK: moves all ext freely  Assessment/Plan:  Active Problems:   CHF exacerbation (HCC)  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 Exam reveals bibasilar crackles and minimal LE edema. Pulmonology has been consulted and they are working up secondary causes for her [place]. We will keep monitoring fluid status as her PAH is pre-load dependent. --pulmonology consulted - appreciate their assistance --continue gentle diuresis; lasix 60m BID x 2 doses  Symptomatic Anemia: Patient presented with shortness of breath, history of melena, and acute Hgb drop to 6.5. She is s/p 2U PRBC's and her Hgb is maintaining at 9. Spoke with Dr. BRomilda Garretregarding pulmonary status and pulm recommendations - since patient is not having active bleeding and Hgb remains stable, would favor treating with PPI and sucralfate for now while PAH is worked up and managed. If patient begins actively bleeding, or significant drop in Hgb occurs, would begin workup (if hemodynamically stable) with upper GI series and  barium enema, and EGD/Colo if significant bleeding suspected.  --f/u CBC --Protonix 463mBID, sucralfate --continue holding asa   CKD3:  Stable, Cr 1.26 this AM --f/u AM BMP   DM2: Hgb A1c 6 on 8/31 -Hold home metformin -SSI  HTN:  Continue home amlodipine 1035maily  HLD:  Continue home Lipitor 28m25mily  Dispo: Anticipated discharge in approximately 1-2 day(s).   GoriAlphonzo Grieve 07/14/2016, 1:24 PM Pager [phone]

## 2016-07-14 NOTE — Consult Note (Signed)
Name: Kathleen Becker MRN: [id number] DOB: 02-16-1946    ADMISSION DATE:  07/13/2016 CONSULTATION DATE:  11/15  REFERRING MD :  Heber [place]  CHIEF COMPLAINT: Shortness of Breath   BRIEF PATIENT DESCRIPTION:   70 year old female with PMH of HTN, HLD, Type 2 Diabetes, CHF, current smoker (about 2 packs per week), severe pulmonary HTN presents to ED on 11/14 with complaints of dyspnea. Over the last couple of days as been able to control these symptoms with use of her incentive spirometer, however on 11/13 while lying in bed became acutely dyspneic and was unable to find relief. Upon arrival to ED was found to be anemic with a hemoglobin of 6.6 with a positive heme test. The patient reported that a month ago had dark stools but has not noticed any recently.    Recent hospitalization for dyspnea from 10/12-10/20 at [facility] where she underwent a Left Heart Cath on 10/19 that showed no cardiac disease but new diagnosis of Pulmonary HTN with PA pressure of 70, placed on atenolol 50 mg daily. Upon discharge was sent home on 2L [place] and a follow appointment with pulmonology.   Due to current pulmonary status, GI has requested a consult to pulmonology before undergoing invasive procedures to assist in optimizing timing of endoscopy and colonoscopy.   SIGNIFICANT EVENTS  10/12-10/20: Hospitalization at [facility] for dyspnea  11/14: Admit for dyspnea and acute anemia   STUDIES:  Echo 10/13 > EF 35-32% with diastolic dysfunction, LVH. Normal RV size and fxn, slightly dilated RA, moderate TR CXR 11/14 > CHF with pulmonary interstitial edema and bilateral pleural effusions greatest on the right  PAST MEDICAL HISTORY :   has a past medical history of Diabetes mellitus without complication ([place]); High cholesterol; and Hypertension.  has no past surgical history on file. Prior to Admission medications   Medication Sig Start Date End Date Taking? Authorizing Provider   albuterol (PROVENTIL HFA;VENTOLIN HFA) 108 (90 Base) MCG/ACT inhaler Inhale 1-2 puffs into the lungs every 6 (six) hours as needed for wheezing or shortness of breath.   Yes Historical Provider, MD  amLODipine (NORVASC) 10 MG tablet Take 10 mg by mouth daily.   Yes Historical Provider, MD  aspirin EC 81 MG tablet Take 81 mg by mouth daily.   Yes Historical Provider, MD  atenolol (TENORMIN) 50 MG tablet Take 50 mg by mouth daily.   Yes Historical Provider, MD  atorvastatin (LIPITOR) 80 MG tablet Take 80 mg by mouth daily.   Yes Historical Provider, MD  dorzolamide-timolol (COSOPT) 22.3-6.8 MG/ML ophthalmic solution Place 1 drop into both eyes 2 (two) times daily.   Yes Historical Provider, MD  glucose blood test strip 1 each by Other route as needed for other (blood glucose). Use as instructed   Yes Historical Provider, MD  metFORMIN (GLUCOPHAGE) 500 MG tablet Take 500 mg by mouth 2 (two) times daily with a meal.   Yes Historical Provider, MD   Allergies  Allergen Reactions  . Ace Inhibitors Swelling  . Lisinopril Swelling    FAMILY HISTORY:  family history is not on file. SOCIAL HISTORY:  reports that she has never smoked. She has never used smokeless tobacco.  REVIEW OF SYSTEMS: Positive in bold   Constitutional: Negative for fever, chills, weight loss, malaise/fatigue and diaphoresis.  HENT: Negative for hearing loss, ear pain, nosebleeds, congestion, sore throat, neck pain, tinnitus and ear discharge.   Eyes: Negative for blurred vision, double vision, photophobia, pain, discharge  and redness.  Respiratory: Negative for cough, hemoptysis, sputum production, shortness of breath, wheezing and stridor.   Cardiovascular: Negative for chest pain, palpitations, orthopnea, claudication, leg swelling and PND.  Gastrointestinal: Negative for heartburn, nausea, vomiting, abdominal pain, diarrhea, constipation, blood in stool and melena.  Genitourinary: Negative for dysuria, urgency, frequency,  hematuria and flank pain.  Musculoskeletal: Negative for myalgias, back pain, joint pain and falls.  Skin: Negative for itching and rash.  Neurological: Negative for dizziness, tingling, tremors, sensory change, speech change, focal weakness, seizures, loss of consciousness, weakness and headaches.  Endo/Heme/Allergies: Negative for environmental allergies and polydipsia. Does not bruise/bleed easily.  SUBJECTIVE:  No acute distress. Resting comfortably   VITAL SIGNS: Temp:  [97.4 F (36.3 C)-99.3 F (37.4 C)] 99.3 F (37.4 C) (11/15 1220) Pulse Rate:  [65-87] 79 (11/15 1000) Resp:  [16-32] 25 (11/15 1000) BP: (114-188)/(55-119) 142/60 (11/15 1220) SpO2:  [90 %-100 %] 90 % (11/15 1000) FiO2 (%):  [45 %] 45 % (11/14 2200) Weight:  [74.3 kg (163 lb 12.8 oz)-76.4 kg (168 lb 6.9 oz)] 76.4 kg (168 lb 6.9 oz) (11/15 0600)  PHYSICAL EXAMINATION: General: Elderly female resting comfortably in bed Neuro:  Alert and oriented x 4, fully intact  HEENT:  NAD  Cardiovascular:  No MRG, s1/s2  Lungs:  On 5 L [place], diminshed breath sounds to bases  Abdomen:  Active bowel sounds, non-tender  Musculoskeletal:  No deformities  Skin:  Intact, dry, clean    Recent Labs Lab 07/13/16 0829 07/14/16 0207  NA 137 140  K 4.2 3.6  CL 104 104  CO2 26 29  BUN 14 14  CREATININE 1.13* 1.26*  GLUCOSE 163* 144*    Recent Labs Lab 07/13/16 0829 07/13/16 2021 07/14/16 0207  HGB 6.6* 9.5* 9.2*  HCT 21.6* 29.6* 29.4*  WBC 9.1 8.2 8.4  PLT 307 226 222   Dg Chest Port 1 View  Result Date: 07/13/2016 CLINICAL DATA:  Two days of shortness of breath EXAM: PORTABLE CHEST 1 VIEW COMPARISON:  Report of a chest x-ray of Jan 07, 2003 FINDINGS: The lungs are adequately inflated. There is bibasilar increased density. Pleural effusions are present greatest on the right. The cardiac silhouette is enlarged. The pulmonary vascularity is engorged. There is calcification in the wall of the aortic arch. The observed  bony thorax is unremarkable. IMPRESSION: CHF with pulmonary interstitial edema and bilateral pleural effusions greatest on the right. Bibasilar atelectasis or pneumonia is suspected as well. Thoracic aortic atherosclerosis. Electronically Signed   By: David  Martinique M.D.   On: 07/13/2016 09:33    ASSESSMENT / PLAN:  Pulmonary Artery HTN possibly related to systemic HTN and dCHF, however considering PA pressure of 70 could be related to other factors that we would want to rule out.  H/O: Recent Hospitalization for dyspnea with Left Heart Cath showing no cardiac disease, however a PA pressure of 70, new atenolol and 2L oxygen requirement at home  -Supplemental oxygen to keep saturation >88 -Continue Atenolol  -Diuresis as BP and renal function tolerate -Strict I & O  -Will need Outpatient Pulm follow up   Certainly at some increased risk for pulmonary complications. Would continue diureses and use minimum anesthesia necessary.    Hayden Pedro, AG-ACNP [facility]  Pgr: [phone]  PCCM Pgr: [phone]   Attending Note:  I have examined patient, reviewed labs, studies and notes. I have discussed the case with Beaulah Corin, and I agree with the data and plans  as amended above.   70 yo woman with MMP including HTN and diastolic dysfxn, DM, tobacco abuse with presumed COPD. She had a recent hospitalization for dyspnea that prompted L and R heart cath that were significant for PASP 70, no reported CAD. Her PASP improved to ~50 after diuresis and systemic BP control. She was found to be hypoxemic and was started on home O2. V/q was low prob at [facility]. She is now admitted here with symptomatic anemia in setting GIB. We are asked to eval her dyspnea, secondary PAH, risks for EGD to work up the GIB.   Suspect that she does have significant COPD based on her hypoxemia, tobacco hx and secondary PAH. Other potential contributors to her PAH include systemic HTN,  possible OSA. Should also consider and rule out auto-immune disease, HIV.  She did take diet pills over 30 years ago - she cannot recall which one. We will help guide the workup. Most important will be to quantify and address her presumed COPD, avoid hypoxemia.   With regard to her pending EGD, she is at significantly increased risk for a procedure under general anesthesia, moderately increased risk for conscious sedation. I do not believe there is any absolute barrier to a procedure. We will plan to get PFT, auto-immune labs. This can be done in parallel with the GI workup.     Baltazar Apo, MD, PhD 07/14/2016, 2:55 PM  Pulmonary and Critical Care [phone] or if no answer [phone]

## 2016-07-15 ENCOUNTER — Other Ambulatory Visit: Payer: Self-pay | Admitting: Internal Medicine

## 2016-07-15 DIAGNOSIS — D62 Acute posthemorrhagic anemia: Secondary | ICD-10-CM

## 2016-07-15 DIAGNOSIS — I5023 Acute on chronic systolic (congestive) heart failure: Secondary | ICD-10-CM

## 2016-07-15 DIAGNOSIS — I50813 Acute on chronic right heart failure: Secondary | ICD-10-CM

## 2016-07-15 DIAGNOSIS — Z9889 Other specified postprocedural states: Secondary | ICD-10-CM

## 2016-07-15 DIAGNOSIS — D649 Anemia, unspecified: Secondary | ICD-10-CM

## 2016-07-15 DIAGNOSIS — I5033 Acute on chronic diastolic (congestive) heart failure: Secondary | ICD-10-CM

## 2016-07-15 DIAGNOSIS — I5031 Acute diastolic (congestive) heart failure: Secondary | ICD-10-CM

## 2016-07-15 DIAGNOSIS — I272 Pulmonary hypertension, unspecified: Secondary | ICD-10-CM

## 2016-07-15 DIAGNOSIS — J9621 Acute and chronic respiratory failure with hypoxia: Secondary | ICD-10-CM

## 2016-07-15 LAB — BASIC METABOLIC PANEL
Anion gap: 8 (ref 5–15)
BUN: 13 mg/dL (ref 6–20)
CO2: 31 mmol/L (ref 22–32)
Calcium: 9.2 mg/dL (ref 8.9–10.3)
Chloride: 101 mmol/L (ref 101–111)
Creatinine, Ser: 1.34 mg/dL — ABNORMAL HIGH (ref 0.44–1.00)
GFR calc Af Amer: 45 mL/min — ABNORMAL LOW (ref >60)
GFR calc non Af Amer: 39 mL/min — ABNORMAL LOW (ref >60)
Glucose, Bld: 104 mg/dL — ABNORMAL HIGH (ref 65–99)
Potassium: 3.6 mmol/L (ref 3.5–5.1)
Sodium: 140 mmol/L (ref 135–145)

## 2016-07-15 LAB — ANCA TITERS

## 2016-07-15 LAB — GLUCOSE, CAPILLARY
Glucose-Capillary: 111 mg/dL — ABNORMAL HIGH (ref 65–99)
Glucose-Capillary: 143 mg/dL — ABNORMAL HIGH (ref 65–99)
Glucose-Capillary: 128 mg/dL — ABNORMAL HIGH (ref 65–99)
Glucose-Capillary: 177 mg/dL — ABNORMAL HIGH (ref 65–99)

## 2016-07-15 LAB — ANTI-DNA ANTIBODY, DOUBLE-STRANDED: ds DNA Ab: <1 IU/mL (ref 0–9)

## 2016-07-15 LAB — RHEUMATOID FACTOR: RHEUMATOID FACTOR: 10.8 [IU]/mL (ref 0.0–13.9)

## 2016-07-15 LAB — CBC
HCT: 30.1 % — ABNORMAL LOW (ref 36.0–46.0)
Hemoglobin: 9.4 g/dL — ABNORMAL LOW (ref 12.0–15.0)
MCH: 28.1 pg (ref 26.0–34.0)
MCHC: 31.2 g/dL (ref 30.0–36.0)
MCV: 89.9 fL (ref 78.0–100.0)
Platelets: 209 10*3/uL (ref 150–400)
RBC: 3.35 MIL/uL — ABNORMAL LOW (ref 3.87–5.11)
RDW: 18.3 % — ABNORMAL HIGH (ref 11.5–15.5)
WBC: 6.9 10*3/uL (ref 4.0–10.5)

## 2016-07-15 LAB — ANTI-SMITH ANTIBODY: ENA SM Ab Ser-aCnc: <0.2 AI (ref 0.0–0.9)

## 2016-07-15 LAB — ANTINUCLEAR ANTIBODIES, IFA: ANA Ab, IFA: POSITIVE — AB

## 2016-07-15 LAB — HIV ANTIBODY (ROUTINE TESTING W REFLEX): HIV SCREEN 4TH GENERATION: NONREACTIVE

## 2016-07-15 LAB — FANA STAINING PATTERNS

## 2016-07-15 LAB — SJOGRENS SYNDROME-B EXTRACTABLE NUCLEAR ANTIBODY: SSB (La) (ENA) Antibody, IgG: <0.2 AI (ref 0.0–0.9)

## 2016-07-15 LAB — ANTI-SCLERODERMA ANTIBODY: Scleroderma (Scl-70) (ENA) Antibody, IgG: <0.2 AI (ref 0.0–0.9)

## 2016-07-15 LAB — ANTI-JO 1 ANTIBODY, IGG

## 2016-07-15 LAB — SJOGRENS SYNDROME-A EXTRACTABLE NUCLEAR ANTIBODY: SSA (Ro) (ENA) Antibody, IgG: <0.2 AI (ref 0.0–0.9)

## 2016-07-15 LAB — FERRITIN: Ferritin: 30 ng/mL (ref 11–307)

## 2016-07-15 MED ORDER — SODIUM BICARBONATE 8.4 % IV SOLN
INTRAVENOUS | Status: DC
Start: 2016-07-15 — End: 2016-07-15
  Filled 2016-07-15: qty 100

## 2016-07-15 MED ORDER — TIOTROPIUM BROMIDE MONOHYDRATE 18 MCG IN CAPS
18.0000 ug | ORAL_CAPSULE | Freq: Every day | RESPIRATORY_TRACT | Status: DC
  Administered 2016-07-16: 18 ug via RESPIRATORY_TRACT
  Filled 2016-07-15: qty 5

## 2016-07-15 NOTE — Progress Notes (Signed)
Subjective:  Patient states she is doing well today; no more dyspnea, denies cough, fevers, chills. We discussed the plan from GI perspective(no endoscopy at this time) and patient is agreeable to following up with Dr. Cristina Gong outpatient (and sooner if active bleeding present).   She wishes to follow up with pulmonology in [place]. She is agreeable to following up with Dr. Beryle Beams for discussion of anemia workup.  Objective:  Vital signs in last 24 hours: Vitals:   07/15/16 0300 07/15/16 0400 07/15/16 0600 07/15/16 0738  BP:  (!) 135/100 (!) 147/58 (!) 150/77  Pulse: 66 89 68 73  Resp: (!) 21 (!) 28 20 (!) 22  Temp:  98.4 F (36.9 C)  99.1 F (37.3 C)  TempSrc:  Oral  Oral  SpO2: 96% (!) 84% 97% 95%  Weight:  167 lb 4.8 oz (75.9 kg)    Height:       Constitutional: NAD, sitting up comfortably in bed, VS reviewed CV: RRR, systolic murmur best heard on RUSB, no rubs or gallops appreciated Resp: CTAB, no wheezing or increased work of breathing Abd: soft, NDNT, +BS MSK: moves all ext freely  Assessment/Plan:  Principal Problem:   Acute on chronic respiratory failure with hypoxemia (HCC) Active Problems:   Acute diastolic congestive heart failure (HCC)   Essential hypertension   Anemia   Obesity (BMI 30.0-34.9)   Type 2 diabetes mellitus ([place])  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 On exam, she appears euvolemic. Pulmonology has been consulted and they are working up secondary causes for her [place]. We will keep monitoring fluid status as her PAH is pre-load dependent. --pulmonology consulted - appreciate their assistance; will set up outpatient follow up --possible discharge today with pulm and PCP follow up; will send home with O2  Normocytic Anemia: Patient presented with shortness of breath, history of  melena, and acute Hgb drop to 6.5. She is s/p 2U PRBC's and her Hgb is maintaining at 9. Spoke with Dr. Cristina Gong regarding pulmonary status and pulm recommendations - since patient is not having active bleeding and Hgb remains stable, would favor treating with PPI and sucralfate for now while PAH is worked up and managed. If patient begins actively bleeding, or significant drop in Hgb occurs, would begin workup (if hemodynamically stable) with upper GI series and barium enema, and EGD/Colo if significant bleeding suspected. With normocytic anemia, high LDH and retic count, we are going to pursue further hematological testing. Considering her age, myeloma is on the radar; we will also  --f/u Haptoglobin, IFE, G6PD, pyruvate kinase --Protonix 47m BID, sucralfate --continue holding asa x 2 weeks --Will f/u with Dr. GBeryle Beams Hematology, in clinic --Will f/u with Dr. BCristina Gongoutpatient  CKD3:  Stable, Cr 1.34 this AM  DM2: Hgb A1c 6 on 8/31 -Hold home metformin -SSI  HTN:  Continue home amlodipine 131mdaily and atenolol 5061mID  HLD:  Continue home Lipitor 86m15mily  Dispo: Anticipated discharge possibly today or tomorrow.   GoriAlphonzo Grieve 07/15/2016, 8:53 AM Pager [phone]

## 2016-07-15 NOTE — Progress Notes (Signed)
Name: Genie Wenke MRN: [id number] DOB: [birth year]    ADMISSION DATE:  07/13/2016 CONSULTATION DATE:  11/15  REFERRING MD :  Heber [place]  CHIEF COMPLAINT: Shortness of Breath   BRIEF PATIENT DESCRIPTION:   70 year old female with PMH of HTN, HLD, Type 2 Diabetes, CHF, current smoker (about 2 packs per week), severe pulmonary HTN presents to ED on 11/14 with complaints of dyspnea. Over the last couple of days as been able to control these symptoms with use of her incentive spirometer, however on 11/13 while lying in bed became acutely dyspneic and was unable to find relief. Upon arrival to ED was found to be anemic with a hemoglobin of 6.6 with a positive heme test.   SIGNIFICANT EVENTS  10/12-10/20: Hospitalization at [facility] for dyspnea  11/14: Admit for dyspnea and acute anemia   STUDIES:  Echo 10/13 > EF 46-65% with diastolic dysfunction, LVH. Normal RV size and fxn, slightly dilated RA, moderate TR CXR 11/14 > CHF with pulmonary interstitial edema and bilateral pleural effusions greatest on the right  SUBJECTIVE:  No acute distress. Resting comfortably on 2L [place]  VITAL SIGNS: Temp:  [98.4 F (36.9 C)-99.3 F (37.4 C)] 99.1 F (37.3 C) (11/16 0738) Pulse Rate:  [64-89] 73 (11/16 0738) Resp:  [15-28] 22 (11/16 0738) BP: (118-157)/(56-100) 150/77 (11/16 0738) SpO2:  [84 %-98 %] 95 % (11/16 0738) Weight:  [75.9 kg (167 lb 4.8 oz)] 75.9 kg (167 lb 4.8 oz) (11/16 0400)  PHYSICAL EXAMINATION:  General: Elderly female resting comfortably in bed Neuro:  Alert and oriented x 4, fully intact  HEENT:  NAD  Cardiovascular:  No MRG, s1/s2  Lungs:  On 2 L [place], diminshed breath sounds to bases  Abdomen:  Active bowel sounds, non-tender  Musculoskeletal:  No deformities  Skin:  Intact, dry, clean    Recent Labs Lab 07/13/16 0829 07/14/16 0207 07/15/16 0213  NA 137 140 140  K 4.2 3.6 3.6  CL 104 104 101  CO2 _0 BUN _1 CREATININE 1.13* 1.26* 1.34*    GLUCOSE 163* 144* 104*    Recent Labs Lab 07/13/16 2021 07/14/16 0207 07/15/16 0213  HGB 9.5* 9.2* 9.4*  HCT 29.6* 29.4* 30.1*  WBC 8.2 8.4 6.9  PLT 226 222 209   No results found.  ASSESSMENT / PLAN:  Pulmonary Artery HTN with history of dCHF, however there is suspicion that she may have undiagnosed pulmonary disease based on her recent O2 requirement and longtime smoking history. Also may be a component on undiagnosed OSA.  Plan: Follow up PFT, auto-immune workup Supplemental O2 as needed to keep SPO2 > 90% Smoking cessation counseling Will add Spiriva daily Will need pulmonary follow up with Dr. Lamonte Sakai May benefit from PSG as outpatient  GI now electing to hold off EGD in absence of current s/s bleeding and ongoing pulmonary workup.   Georgann Housekeeper, AGACNP-BC [facility] Pager [phone] or [phone] 07/15/2016 11:03 AM   Attending Note:  I have examined patient, reviewed labs, studies and notes. I have discussed the case with Jaclynn Guarneri, and I agree with the data and plans as amended above. Breathing appears to be comfortable today on my exam. Currently on  O2 and no dyspnea. Await PFT, auto-immune labs. Will plan to start Spiriva today, follow labs. Agree with aggressive BP control. She likely needs an outpt PSG. She has stabilized from GI standpoint.   Baltazar Apo, MD, PhD 07/15/2016, 11:38 AM  [facility] [phone] or if no answer [phone]

## 2016-07-15 NOTE — Discharge Summary (Signed)
Medicine attending discharge note: I personally interviewed and examined this patient on the day of planned discharge 07/15/2016 and I attest to the accuracy of the discharge evaluation and plan as recorded by resident physician Dr. Alphonzo Grieve.  Clinical summary: Pleasant 70 year old woman who states that she only smoked 1 or 2 cigarettes per day and stopped about 5 years ago. She was recently admitted to [facility] on October 12 with acute dyspnea. Partial information available through care everywhere. A limited transthoracic echocardiogram suggested pulmonary hypertension with pulmonary artery pressure 50 mmHg. Ventilation perfusion scan of the lungs low probability for pulmonary embolism. Lower extremity venous Dopplers negative for blood clots. Chest x-ray with small bilateral pleural effusions. She had a left heart cath which showed normal left ventricular ejection fraction, normal coronaries, and pulmonary artery pressure of 70 mm. This report is not in the chart at time of this dictation. She was discharged on October 20. Signout diagnoses were pulmonary hypertension and cor pulmonale. She was sent home on oxygen. She presented here on November 14 with a 24-hour history of increasing dyspnea. She admitted to passing dark stools. No hematochezia. Last colonoscopy in 2004. She was one 81 mg daily aspirin. No other nonsteroidals. On initial exam lungs were clear to auscultation. No respiratory distress. Regular cardiac rhythm without murmur gallop or rub. No peripheral edema. No acute ischemic changes on EKG. Sinus rhythm. Hemoglobin 6.6 with MCV 90. Recent hemoglobin at the other hospital was 9.2 on 10/19.  Hospital course: She was transfused with 2 units of packed blood cells. Hemoglobin remained stable at or above 9 g. She was seen in consultation by gastroenterology. Given her recent diagnosis of severe pulmonary hypertension, he felt it would be best for the patient to get  pulmonary clearance prior to doing any endoscopic procedures. He did think that the acute fall in her hemoglobin might be related to gastritis from aspirin use. Ferritin was low-normal at 30. He plans to follow the patient as an outpatient and reevaluate need for endoscopy. With respect to her chronic normochromic anemia, she has evidence of low-grade hemolysis with borderline elevation of LDH at 238 normal up to 192, uncorrected reticulocyte count 4% with a hemoglobin of 9, but normal bilirubin of 1.1. Coombs test was negative. Haptoglobin pending. Review of the peripheral blood film by myself and Dr. Jari Favre shows 2+ polychromasia, 1-2 plus spherocytes, 1-2 plus schistocytes with normal platelet morphology and number. No basophilic stippling. No target cells to suggest thalassemia. Patient gives a negative family history of anemia. Denies sickle cell. Although the increased reticulocyte count may be an appropriate response to acute blood loss, low-grade chronic hemolytic anemia not excluded at this time. We will send off additional testing including common Red cell enzymes G6PD and pyruvate kinase, and an osmotic fragility test to rule out hereditary spherocytosis. Screen for myeloma. Additional tests to rule out vasculitis and collagen vascular disease obtained and results pending.  With respect to her recently diagnosed pulmonary hypertension, pulmonary function tests will be done. Blood gas done in the emergency department with pH 7.29, PO2 86, PCO2 56. She was seen in consultation by pulmonary medicine. They also plan to follow her as an outpatient.  The patient's daughter is present. Status and management plan discussed. The patient stated that she would like to follow-up with [place] here in [place].  Disposition: Condition stable on day of planned discharge Follow-up in our Gen. medicine clinic and in my hematology clinic There were no complications

## 2016-07-16 ENCOUNTER — Inpatient Hospital Stay (HOSPITAL_COMMUNITY): Payer: Medicare Other

## 2016-07-16 DIAGNOSIS — I272 Pulmonary hypertension, unspecified: Secondary | ICD-10-CM

## 2016-07-16 DIAGNOSIS — K922 Gastrointestinal hemorrhage, unspecified: Secondary | ICD-10-CM

## 2016-07-16 DIAGNOSIS — D588 Other specified hereditary hemolytic anemias: Secondary | ICD-10-CM

## 2016-07-16 LAB — GLUCOSE, CAPILLARY
GLUCOSE-CAPILLARY: 184 mg/dL — AB (ref 65–99)
GLUCOSE-CAPILLARY: 109 mg/dL — AB (ref 65–99)
Glucose-Capillary: 104 mg/dL — ABNORMAL HIGH (ref 65–99)

## 2016-07-16 LAB — BASIC METABOLIC PANEL
ANION GAP: 8 (ref 5–15)
BUN: 10 mg/dL (ref 6–20)
CO2: 32 mmol/L (ref 22–32)
Calcium: 9.2 mg/dL (ref 8.9–10.3)
Chloride: 99 mmol/L — ABNORMAL LOW (ref 101–111)
Creatinine, Ser: 1.22 mg/dL — ABNORMAL HIGH (ref 0.44–1.00)
GFR, EST AFRICAN AMERICAN: 51 mL/min — AB (ref >60)
GFR, EST NON AFRICAN AMERICAN: 44 mL/min — AB (ref >60)
GLUCOSE: 103 mg/dL — AB (ref 65–99)
POTASSIUM: 3.2 mmol/L — AB (ref 3.5–5.1)
Sodium: 139 mmol/L (ref 135–145)

## 2016-07-16 LAB — CBC
HEMATOCRIT: 30.5 % — AB (ref 36.0–46.0)
Hemoglobin: 9.4 g/dL — ABNORMAL LOW (ref 12.0–15.0)
MCH: 27.9 pg (ref 26.0–34.0)
MCHC: 30.8 g/dL (ref 30.0–36.0)
MCV: 90.5 fL (ref 78.0–100.0)
PLATELETS: 215 10*3/uL (ref 150–400)
RBC: 3.37 MIL/uL — AB (ref 3.87–5.11)
RDW: 18.1 % — ABNORMAL HIGH (ref 11.5–15.5)
WBC: 5.6 10*3/uL (ref 4.0–10.5)

## 2016-07-16 LAB — GLUCOSE 6 PHOSPHATE DEHYDROGENASE
G6PDH: 10.6 U/g{Hb} (ref 4.6–13.5)
HEMOGLOBIN: 9.4 g/dL — AB (ref 11.1–15.9)

## 2016-07-16 LAB — HAPTOGLOBIN: Haptoglobin: 15 mg/dL — ABNORMAL LOW (ref 34–200)

## 2016-07-16 MED ORDER — SUCRALFATE 1 GM/10ML PO SUSP: 1.0000 g | Freq: Three times a day (TID) | ORAL | 0 refills | Status: DC

## 2016-07-16 MED ORDER — ALBUTEROL SULFATE (2.5 MG/3ML) 0.083% IN NEBU
2.5000 mg | INHALATION_SOLUTION | Freq: Once | RESPIRATORY_TRACT | Status: AC
  Administered 2016-07-16: 2.5 mg via RESPIRATORY_TRACT

## 2016-07-16 MED ORDER — TIOTROPIUM BROMIDE MONOHYDRATE 18 MCG IN CAPS: 18.0000 ug | ORAL_CAPSULE | Freq: Every day | RESPIRATORY_TRACT | 12 refills | Status: DC

## 2016-07-16 MED ORDER — POTASSIUM CHLORIDE CRYS ER 20 MEQ PO TBCR
40.0000 meq | EXTENDED_RELEASE_TABLET | Freq: Once | ORAL | Status: AC
  Administered 2016-07-16: 40 meq via ORAL
  Filled 2016-07-16: qty 2

## 2016-07-16 MED ORDER — FUROSEMIDE 20 MG PO TABS: 20.0000 mg | ORAL_TABLET | Freq: Every day | ORAL | 2 refills | Status: DC | PRN

## 2016-07-16 MED ORDER — PANTOPRAZOLE SODIUM 40 MG PO TBEC: 40.0000 mg | DELAYED_RELEASE_TABLET | Freq: Two times a day (BID) | ORAL | 5 refills | Status: DC

## 2016-07-16 NOTE — Progress Notes (Signed)
Patient's hemoglobin has remained stable since transfusion.  I have discussed the case with Dr. Beryle Beams and with Dr. Lamonte Sakai.  Dr. Beryle Beams points out that there is evidence for hemolysis, with a low haptoglobin, elevated LDH, and compatible exam of the peripheral smear. Thus, it is possible that her anemia is not entirely, or even primarily, due to aspirin-induced gastropathy or other forms of GI blood loss.  The patient's pulmonary function test results are not yet available. Consideration is being given to some sort of collagen vascular disease in view of the patient's positive ANA.  If therefore seems to be very appropriate to continue to hold off on GI tract evaluation until we have a better appreciation for the patient's cardiopulmonary status. We will continue with her previously stated plan for the patient to call and arrange office follow-up with me for approximately a month from now, by which time we will have a better understanding of her cardiopulmonary status and her GI/anemia status as well.  Cleotis Nipper, M.D. Pager [phone] If no answer or after 5 PM call [phone]

## 2016-07-16 NOTE — Progress Notes (Signed)
   Subjective:  No acute events overnight. Patient states she feels back to baseline in terms of her breathing. She denies worsening shortness of breath, any chest pain, dizziness. She has no further concerns and is agreeable to discharge with outpatient follow up with Pulm, Heme, and Rheum.  Objective:  Vital signs in last 24 hours: Vitals:   07/16/16 0300 07/16/16 0800 07/16/16 0853 07/16/16 1210  BP: (!) 154/66 (!) 142/64  (!) 148/67  Pulse: 64 64  67  Resp: (!) 21 20  (!) 25  Temp: 98.2 F (36.8 C) 98.1 F (36.7 C)  98.3 F (36.8 C)  TempSrc: Oral Oral  Oral  SpO2: 90% 95% 91% 95%  Weight: 75.5 kg (166 lb 6.4 oz)     Height:       Constitutional: NAD, sitting up comfortably in bed, VS reviewed CV: RRR, systolic murmur best heard on RUSB, no rubs or gallops appreciated, minimal LE edema present Resp: CTAB, no wheezing or increased work of breathing Abd: soft, NDNT, +BS MSK: moves all ext freely  Assessment/Plan:  Principal Problem:   Acute on chronic respiratory failure with hypoxemia (HCC) Active Problems:   Acute diastolic congestive heart failure (HCC)   Essential hypertension   Symptomatic anemia   Obesity (BMI 30.0-34.9)   Type 2 diabetes mellitus ([place])   Pulmonary hypertension   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 On exam, she appears euvolemic. Pulmonology has been consulted and they are working up secondary causes for her [place]. 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 Other labs have been negative. --pulmonology consulted - appreciate their assistance --f/u with Dr. Lamonte Sakai in about 1 month --working on Rheum referral outpatient --will ambulate to determine  oxygen --possible discharge today with pulm and PCP follow up; will send home with O2  Normocytic Anemia: With normocytic anemia, high LDH and retic count, and low haptoglobin; her picture is unclear due to recent blood loss and transfusion which would have similar retic and haptoglobin levels secondarily. But patient had normocytic anemia before acute in Hgb on admission so work up thus far is still consistent with hemolytic anemia.  --f/u IFE, G6PD, pyruvate kinase --Protonix 60m BID, sucralfate QID --continue holding asa x 2 weeks --Will f/u with Dr. GBeryle Beams Hematology, in clinic --Will f/u with Dr. BCristina Gongoutpatient in about 1 month  CKD3:  Stable, Cr 1.2 this AM  DM2: Hgb A1c 6 on 8/31 -Hold home metformin; restart on discharge -SSI  HTN:  Continue home amlodipine 156mdaily and atenolol 5067mID  HLD:  Continue home Lipitor 10m11mily  Dispo: Anticipated discharge today.   GoriAlphonzo Grieve 07/16/2016, 1:07 PM Pager [phone]

## 2016-07-16 NOTE — Progress Notes (Signed)
Name: Kathleen Becker MRN: [id number] DOB: [birth year]    ADMISSION DATE:  07/13/2016 CONSULTATION DATE:  11/15  REFERRING MD :  Heber Jourdanton  CHIEF COMPLAINT: Shortness of Breath   BRIEF PATIENT DESCRIPTION:   70 year old female with PMH of HTN, HLD, Type 2 Diabetes, CHF, current smoker (about 2 packs per week), severe pulmonary HTN presents to ED on 11/14 with complaints of dyspnea. Over the last couple of days as been able to control these symptoms with use of her incentive spirometer, however on 11/13 while lying in bed became acutely dyspneic and was unable to find relief. Upon arrival to ED was found to be anemic with a hemoglobin of 6.6 with a positive heme test.   SIGNIFICANT EVENTS  10/12-10/20: Hospitalization at [facility] for dyspnea  11/14: Admit for dyspnea and acute anemia   STUDIES:  Echo 10/13 > EF 77-41% with diastolic dysfunction, LVH. Normal RV size and fxn, slightly dilated RA, moderate TR CXR 11/14 > CHF with pulmonary interstitial edema and bilateral pleural effusions greatest on the right ANA 11/15 >> positive 1:160, centromere pattern A-Jo Ab, a-Smith Ab, a-scl-70, a-DNA Ab, SSA, SSB, ANCA, RF, HIV 11/15 >> all negative  SUBJECTIVE:  No acute distress. Resting comfortably on 2L [place] Hasn't mobilized, ambulated yet PFT ordered, not yet done.   VITAL SIGNS: Temp:  [97.9 F (36.6 C)-98.7 F (37.1 C)] 98.1 F (36.7 C) (11/17 0800) Pulse Rate:  [64-75] 64 (11/17 0800) Resp:  [16-26] 20 (11/17 0800) BP: (139-154)/(59-86) 142/64 (11/17 0800) SpO2:  [90 %-96 %] 91 % (11/17 0853) Weight:  [75.5 kg (166 lb 6.4 oz)] 75.5 kg (166 lb 6.4 oz) (11/17 0300)  PHYSICAL EXAMINATION:  General: Elderly female resting comfortably in bed Neuro:  Alert and oriented x 4, fully intact  HEENT:  NAD  Cardiovascular:  No MRG, s1/s2  Lungs:  On 2 L [place], diminshed breath sounds to bases  Abdomen:  Active bowel sounds, non-tender  Musculoskeletal:  No deformities  Skin:  Intact,  dry, clean    Recent Labs Lab 07/14/16 0207 07/15/16 0213 07/16/16 0306  NA 140 140 139  K 3.6 3.6 3.2*  CL 104 101 99*  CO2 29 31 32  BUN _0 CREATININE 1.26* 1.34* 1.22*  GLUCOSE 144* 104* 103*    Recent Labs Lab 07/14/16 0207 07/15/16 0213 07/16/16 0306  HGB 9.2* 9.4* 9.4*  HCT 29.4* 30.1* 30.5*  WBC 8.4 6.9 5.6  PLT 222 209 215   No results found.  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 New discover ANA positive in centromere pattern consistent with limited CREST syndrome or SLE. Also may be a component on undiagnosed OSA. COPD, severity unclear Possible OSA GIB, resolved  Plan: Needs to follow up outpatient regarding her PAH. May be a candidate for targeted PAH therapy given the possible contribution connective tissue disease (positive ANA) Supplemental O2 as needed to keep SPO2 > 90%. She needs to start to ambulate, will need walking oxygen titration prior to d/c home to guide O2 dosing (ie does she need more than the prescribed 2L/min to have adequate saturations w walking) Smoking cessation counseling > she quit 2 months ago, supported this Continue Spiriva daily PFT's as planned.  Has pulmonary follow up with Dr. Lamonte Sakai on 08/13/16 She needs to be seen outpatient by Rheumatology for the positive ANA  May benefit from PSG as outpatient  Call if we can help over the weekend.   Baltazar Apo, MD, PhD 07/16/2016, 10:47 AM [facility] [phone] or if no answer [phone]

## 2016-07-16 NOTE — Progress Notes (Signed)
Medicine attending: I examined this patient today and concur with evaluation and management plan which will be recorded by resident physician Dr. Alphonzo Grieve. We greatly appreciate pulmonary/critical care input. As noted, ANA +1:160 in a centromere pattern consistent with crest syndrome. However, Sjogren's antibodies and scleroderma antibodies are negative. Anti-Smith and anti--J0-1 antibodies also negative. Pulmonary function tests completed. Interpretation pending. Hemoglobin stable 72 hours without further transfusion. Currently 9.4. Special hematology studies to further characterize hemolysis are pending. She is clinically stable. Minimal dry rales at the left lung base. Right pupil is distorted and not reactive from previous trauma. Impression: #1. New diagnosis pulmonary artery hypertension-may be primary or related to developing collagen vascular disorder She will follow-up with pulmonary medicine after discharge #2. Acute on chronic anemia with occult blood positive stool on admission. Low normal ferritin. Possible aspirin-related gastritis. Back to baseline hemoglobin following transfusion. 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

## 2016-07-16 NOTE — Discharge Summary (Signed)
Name: Kathleen Becker MRN: [id number] DOB: 09/25/1945 70 y.o. PCP: No primary care provider on file.  Date of Admission: 07/13/2016  8:02 AM Date of Discharge: 07/16/2016 Attending Physician: Dr. Beryle Beams  Discharge Diagnosis: 1. Acute on chronic RV failure 2/2 pulmonary hypertension Principal Problem:   Acute on chronic respiratory failure with hypoxemia (HCC) Active Problems:   Acute diastolic congestive heart failure (HCC)   Essential hypertension   Symptomatic anemia   Obesity (BMI 30.0-34.9)   Type 2 diabetes mellitus ([place])   Pulmonary hypertension   Acute on chronic systolic right heart failure (HCC)   Acute gastrointestinal bleeding   Discharge Medications:   Medication List    STOP taking these medications   aspirin EC 81 MG tablet     TAKE these medications   albuterol 108 (90 Base) MCG/ACT inhaler Commonly known as:  PROVENTIL HFA;VENTOLIN HFA Inhale 1-2 puffs into the lungs every 6 (six) hours as needed for wheezing or shortness of breath.   amLODipine 10 MG tablet Commonly known as:  NORVASC Take 10 mg by mouth daily.   atenolol 50 MG tablet Commonly known as:  TENORMIN Take 50 mg by mouth daily.   atorvastatin 80 MG tablet Commonly known as:  LIPITOR Take 80 mg by mouth daily.   dorzolamide-timolol 22.3-6.8 MG/ML ophthalmic solution Commonly known as:  COSOPT Place 1 drop into both eyes 2 (two) times daily.   furosemide 20 MG tablet Commonly known as:  LASIX Take 1 tablet (20 mg total) by mouth daily as needed.   glucose blood test strip 1 each by Other route as needed for other (blood glucose). Use as instructed   metFORMIN 500 MG tablet Commonly known as:  GLUCOPHAGE Take 500 mg by mouth 2 (two) times daily with a meal.   pantoprazole 40 MG tablet Commonly known as:  PROTONIX Take 1 tablet (40 mg total) by mouth 2 (two) times daily.   sucralfate 1 GM/10ML suspension Commonly known as:  CARAFATE Take 10 mLs (1 g total) by mouth 4  (four) times daily -  with meals and at bedtime.   tiotropium 18 MCG inhalation capsule Commonly known as:  SPIRIVA Place 1 capsule (18 mcg total) into inhaler and inhale daily.       Disposition and follow-up:   Kathleen Becker was discharged from [facility] in Stable condition.  At the hospital follow up visit please address:  1. --Has her oxygen requirement changed? --Is she having lower extremity swelling? --Has she had to take Lasix?  2.  Labs / imaging needed at time of follow-up: BMP, CBC; she will get osmotic fragility test on visit with Dr. Beryle Beams  3.  Pending labs/ test needing follow-up: Pyruvate kinase  Follow-up Appointments: Follow-up Information    Burnard Bunting, MD. Call.   Specialty:  Family Medicine Why:  Call to make an appointment in about 1-2 weeks. Contact information: [address] [place] [place] [address] [phone]        Collene Gobble., MD Follow up on 08/13/2016.   Specialty:  Pulmonary Disease Why:  2:45 PM Contact information: [address] [place] [address] [phone]        Cleotis Nipper, MD. Daphane Shepherd on 08/09/2016.   Specialty:  Gastroenterology Why:  at 10am to follow up with the stomach doctor. Contact information: [address] [place] [address] [phone]        Annia Belt, MD Follow up on 08/16/2016.  Specialty:  Oncology Why:  Tentative date is Dec 18th at 1:30. The office will call you with a new date and time. If you don't hear back by the end of next week, please call the above number to ask about the appointment time.  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 Patient was recently diagnosed with Pulmonary Artery Hypertension earlier this month at [facility] She was found to have right ventricular failure, pulmonary hypertension, and normal left ventricular systolic function; VQ scan was low probability for PE. At that time, she was diuresed and was planning to set up pulmonary follow up in [place] but has not had a chance to do so yet before becoming dyspneic. Patient has been on continuous 2L [place] since discharge from [facility]. On arrival to [facility], her EKG did not show acute ischemic changes and she had a negative troponin; her BNP was elevated to 900's. CXR was positive for pulmonary interstitial edema and bilateral pleural effusion. ABG's were consistant with respiratory acidosis. Patient was started on IV lasix and placed on BiPAP which was weaned off by the following morning. Her oxygen requirement eventually was decreased to 2.5L at rest and 4L with ambulation. Since patient was looking to establish with Pulmonology in [place], we consulted the on-call pulmonologist; they began further workup for her PHT that is detailed below. 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 She did PFT testing before discharge which showed mixed obstructive and restrictive disease. She will follow up with Dr. Lamonte Sakai for further workup and management of pulmonary hypertension which will likely include polysomnography as patient might have component of OSA that is contributing to her respiratory  disease. She will also follow up with rheumatology for further workup of possible CREST syndrome based on her lab values.   Acute on Chronic Normocytic Anemia: Patient with baseline normocytic anemia with acute drop in Hgb to 6.6 on admission with hemoccult positive stool. She had reported history of melanotic stools for which she had colonoscopy (only found one polyp) several years ago; most recent episodes of melena were about a month prior. Her Hgb on discharge from [facility] hospital in Oct were in 10's. Patient received 2UpRBC's and has maintained Hgb at baseline levels of 9-11 every since with no further episodes of melena, BRBPR or other sources of bleeding. With history of melena, her bleeding is likely upper GI; she is on aspirin so it is possible she developed aspirin related gastritis or an ulcer. GI was consulted for further evaluation; since patient had been stable hemodynamically since transfusion, it was decided that she follow up outpatient for repeat colonoscopy and possible EGD. She was instructed to hold her aspirin for 2 weeks,  take protonix 78m twice daily indefinitely, and take sucralfate four times daily. She will follow up with GI outpatient.   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 We checked IFE, G6PD, and pyruvate kinase. IFE shows normal distribution of serum immunoglobulins; G6PD was within normal limits, and pyruvate kinase is still pending. She will meet with Dr. GBeryle Beamsto discuss results at a future date and perform serum osmotic fragility testing (could not perform in hospital as collection kit had to be shipped in).  CKD stage 3: Patient with stable CKD stage 3 that was monitored throughout her hospitalization.  T2DM: Patient wth HgbA1c of 6 in  Aug 2017, managed at home on metformin which was continued at discharge. Patient was managed with sliding scale insulin while inpatient.  HTN: Patient with history of hypertension; she was continued on her home amlodipine 141mdaily and atenolol 509mID once she was stable for PO intake after weaning off of BiPAP.  HLD: Patient with hyperlipidemia continued on her lipitor 71m16mily.   Discharge Vitals:   BP 135/64 (BP Location: Left Arm)   Pulse 77   Temp 98.2 F (36.8 C) (Oral)   Resp 19   Ht _0  (1.626 m)   Wt 75.5 kg (166 lb 6.4 oz)   SpO2 95%   BMI 28.56 kg/m   Pertinent Labs, Studies, and Procedures:  CBC Latest Ref Rng & Units 07/16/2016 07/15/2016 07/14/2016  WBC 4.0 - 10.5 K/uL 5.6 6.9 8.4  Hemoglobin 12.0 - 15.0 g/dL 9.4(L) 9.4(L) 9.2(L)  Hematocrit 36.0 - 46.0 % 30.5(L) 30.1(L) 29.4(L)  Platelets 150 - 400 K/uL 215 209 222   BMP Latest Ref Rng & Units 07/16/2016 07/15/2016 07/14/2016  Glucose 65 - 99 mg/dL 103(H) 104(H) 144(H)  BUN 6 - 20 mg/dL _1 Creatinine 0.44 - 1.00 mg/dL 1.22(H) 1.34(H) 1.26(H)  Sodium 135 - 145 mmol/L 139 140 140  Potassium 3.5 - 5.1 mmol/L 3.2(L) 3.6 3.6  Chloride 101 - 111 mmol/L 99(L) 101 104  CO2 22 - 32 mmol/L 32 31 29  Calcium 8.9 - 10.3 mg/dL 9.2 9.2 9.0    Ref. Range 07/14/2016 15:05  FVC-Pre Latest Units: L 0.77  FVC-%Pred-Pre Latest Units: % 34  FEV1-Pre Latest Units: L 0.52  FEV1-%Pred-Pre Latest Units: % 30  Pre FEV1/FVC ratio Latest Units: % 67  FEV1FVC-%Pred-Pre Latest Units: % 87  FEF 25-75 Pre Latest Units: L/sec 0.30  FEF2575-%Pred-Pre Latest Units: % 18  FEV6-Pre Latest Units: L 0.77  FEV6-%Pred-Pre Latest Units: % 36  Pre FEV6/FVC Ratio Latest Units: % 100  FEV6FVC-%Pred-Pre Latest Units: % 104  FVC-Post Latest Units: L 0.86  FVC-%Pred-Post Latest Units: % 38  FVC-%Change-Post Latest Units: % 10  FEV1-Post Latest Units: L 0.64  FEV1-%Pred-Post Latest Units: % 37  FEV1-%Change-Post Latest Units: % 23    Post FEV1/FVC ratio Latest Units: % 75  FEV1FVC-%Change-Post Latest Units: % 11  FEF 25-75 Post Latest Units: L/sec 0.63  FEF2575-%Pred-Post Latest Units: % 39  FEF2575-%Change-Post Latest Units: % 109  FEV6-Post Latest Units: L 0.86  FEV6-%Pred-Post Latest Units: % 40  FEV6-%Change-Post Latest Units: % 10  Post FEV6/FVC ratio Latest Units: % 100  FEV6FVC-%Pred-Post Latest Units: % 104  FEV6FVC-%Change-Post Latest Units: % 0    Ref. Range 07/14/2016 17:10   ANA Ab, IFA  Unknown Positive (A)   Anti JO-1 Latest Ref Range: 0.0 - 0.9 AI <0.2   ds DNA Ab Latest Ref Range: 0 - 9 IU/mL <1   RA Latex Turbid. Latest Ref Range: 0.0 - 13.9 IU/mL 10.8   Cytoplasmic (C-ANCA) Latest Ref Range: Neg:<1:20 titer <1:20   P-ANCA Latest Ref Range: Neg:<1:20 titer <1:20   Atypical P-ANCA titer Latest Ref Range: Neg:<1:20 titer <1:20   ENA SM Ab Ser-aCnc Latest Ref Range: 0.0 - 0.9 AI <0.2   CENTROMERE AB Unknown 1:160 (H)     Ref. Range 07/14/2016 17:10  ENA SM Ab Ser-aCnc Latest Ref Range: 0.0 - 0.9 AI <0.2  SSA (Ro) (ENA) Antibody, IgG Latest Ref Range: 0.0 - 0.9 AI <0.2  SSB (La) (ENA) Antibody, IgG Latest Ref Range: 0.0 - 0.9 AI <0.2  Scleroderma (Scl-70) (ENA) Antibody, IgG Latest Ref Range: 0.0 - 0.9 AI <0.2   Haptoglobin: 15 (range 34-200) Retic ct: 4% (range 0.4-3.1%) LDH 238 (range 98-192) Coombs test negative G6PD: 10.6 (range 4.6-13.5u/g hgb) Ferritin: 30 (range 11-307) HIV: nonreactive  CXR 07/13/2016: CHF with pulmonary interstitial edema and bilateral pleral effusions greates on the right. Bibasilar atelectasis or pneumonia is suspected as well. Thoracic aortic atherosclerosis.   Discharge Instructions: Discharge Instructions    (HEART FAILURE PATIENTS) Call MD:  Anytime you have any of the following symptoms: 1) 3 pound weight gain in 24 hours or 5 pounds in 1 week 2) shortness of breath, with or without a dry hacking cough 3) swelling in the hands, feet or stomach 4) if you have  to sleep on extra pillows at night in order to breathe.    Complete by:  As directed    Diet - low sodium heart healthy    Complete by:  As directed    Discharge instructions    Complete by:  As directed    Start using Spiriva inhaler once daily for your lungs.  If you start swelling or have more shortness of breath than usual, please call your PCP, Dr. Jerolyn Center. I have given you a script for lasix 18m to take once as needed for this.   Please use 2.5L oxygen when sitting, lying down or sleeping. Please use 4L oxygen when walking.  Stop using aspirin for 2 weeks to prevent stomach bleeding.  Take pantoprazole 462m(one pill) twice a day (30 minutes before breakfast and before dinner). Take Sucralfate with each meal and at night before bed.   Please make an appointment with your PCP, Dr. ElJerolyn Centerto be seen in about 1-2 weeks. She will recheck your blood levels at that time and see if you need to restart aspirin. She will also make the referral to the Rheumatology doctor.   Increase activity slowly    Complete by:  As directed       Signed: GoAlphonzo GrieveMD 07/18/2016, 8:02 PM   Pager [phone]

## 2016-07-16 NOTE — Discharge Instructions (Signed)
Start using Spiriva inhaler once daily for your lungs.  Stop using aspirin for 2 weeks to prevent stomach bleeding.  Take pantoprazole 80m (one pill) twice a day (30 minutes before breakfast and before dinner). Take Sucralfate with each meal and at night before bed.   Use 2.5L oxygen when sitting, lying or sleeping. Use 4L oxygen when walking.  Please make an appointment with your PCP, Dr. EJerolyn Center to be seen in about 1-2 weeks. She will recheck your blood levels at that time and see if you need to restart aspirin. She will also make the referral to the Rheumatology doctor.  If you start swelling or have more shortness of breath than usual, please call your PCP, Dr. EJerolyn Center I have given you a script for lasix 23mto take once as needed for this.

## 2016-07-16 NOTE — Progress Notes (Signed)
Discharged home accompanied by daughter given education instructions to patient and daughter with verbalized understanding.

## 2016-07-19 ENCOUNTER — Telehealth: Payer: Self-pay | Admitting: Emergency Medicine

## 2016-07-19 DIAGNOSIS — J9621 Acute and chronic respiratory failure with hypoxia: Secondary | ICD-10-CM

## 2016-07-19 LAB — IMMUNOFIXATION ELECTROPHORESIS
IGM, SERUM: 26 mg/dL (ref 26–217)
IgA: 193 mg/dL (ref 87–352)
IgG (Immunoglobin G), Serum: 970 mg/dL (ref 700–1600)
TOTAL PROTEIN ELP: 6.2 g/dL (ref 6.0–8.5)

## 2016-07-19 NOTE — Telephone Encounter (Signed)
lmomtcb x 1

## 2016-07-19 NOTE — Telephone Encounter (Signed)
Order placed for 02.  Nothing further needed.

## 2016-07-19 NOTE — Telephone Encounter (Signed)
Pt calling back [phone]

## 2016-07-19 NOTE — Telephone Encounter (Signed)
Spoke with pt's daughter Neoma Laming, states that pt saw RB in hospital, and her 02 orders were changed at hospital discharge to wear up to 4lpm with exertion.  Pt's current 02 setup only goes to 2lpm.  Pt's daughter is requesting that we send an order to [facility] to replace equipment to accommodate her current liter flow settings.  Pt is not currently established in clinic, has a HFU scheduled with RB on 12/15.    RB please advise.  Thanks!

## 2016-07-19 NOTE — Telephone Encounter (Signed)
Ok to send order to her DME to supply 4L/min, most portable system available.

## 2016-08-13 ENCOUNTER — Ambulatory Visit (INDEPENDENT_AMBULATORY_CARE_PROVIDER_SITE_OTHER): Payer: Medicare Other | Admitting: Emergency Medicine

## 2016-08-13 ENCOUNTER — Encounter: Payer: Self-pay | Admitting: Emergency Medicine

## 2016-08-13 DIAGNOSIS — J9611 Chronic respiratory failure with hypoxia: Secondary | ICD-10-CM

## 2016-08-13 DIAGNOSIS — J449 Chronic obstructive pulmonary disease, unspecified: Secondary | ICD-10-CM

## 2016-08-13 NOTE — Assessment & Plan Note (Addendum)
She has stopped smoking. Severe obstruction based on her recent pulnm function testing. She believes that Spiriva may be helping her. We will continue this, add albuterol when necessary. Continue oxygen as ordered.

## 2016-08-13 NOTE — Assessment & Plan Note (Signed)
Suspect largely related to obstructive lung disease. She does also have newly diagnosed disease which will need to be further evaluated. She also may have obstructive sleep apnea and we will evaluate this. We will refer to rheumatology, obtain a polysomnogram. She may merit targeted therapy depending on the workup. I would like to repeat her echocardiogram after we have treated all the identifiable underlying contributors.

## 2016-08-13 NOTE — Assessment & Plan Note (Signed)
Suspect due to obstructive lung disease. Consider also her severe pulmonary hypertension. She may have OSA.

## 2016-08-13 NOTE — Progress Notes (Signed)
Subjective:    Patient ID: Kathleen Becker, female    DOB: 02-16-1946, 70 y.o.   MRN: [id number]  HPI 70 year old female with PMH of HTN And diastolic CHF, HLD, Type 2 Diabetes, CHF, former smoker, severe pulmonary HTN. I met her when she was admitted to the hospital in mid November 2017. She has an echocardiogram from 06/08/2016 that showed a vigorous EF, diastolic dysfunction, LVH, normal size and function. She had newly diagnosed connective tissue disease during the hospitalization with an ANA in a centromere pattern. She was discharged on oxygen 4L/min, spiriva. She is no longer smoking. She also had anemia during that hospitalization - has followed with Dr Cristina Gong.   She underwent PFT 07/14/16 that I personally reviewed. These identified very severe obstruction with an FEV1 of 0.52 L (30%). No volumes done, probable superimposed restriction. No sleep study.     Review of Systems  Constitutional: Negative for fever and unexpected weight change.  HENT: Negative for congestion, dental problem, ear pain, nosebleeds, postnasal drip, rhinorrhea, sinus pressure, sneezing, sore throat and trouble swallowing.   Eyes: Negative for redness and itching.  Respiratory: Negative for chest tightness and wheezing.   Cardiovascular: Negative for palpitations and leg swelling.  Gastrointestinal: Negative for nausea and vomiting.  Genitourinary: Negative for dysuria.  Musculoskeletal: Negative for joint swelling.  Skin: Negative for rash.  Neurological: Negative for headaches.  Hematological: Does not bruise/bleed easily.  Psychiatric/Behavioral: Negative for dysphoric mood. The patient is not nervous/anxious.    As per HPI  Past Medical History:  Diagnosis Date  . Diabetes mellitus without complication ([place])   . High cholesterol   . Hypertension      No family history on file.   Social History   Social History  . Marital status: Divorced    Spouse name: N/A  . Number of children: N/A  .  Years of education: N/A   Occupational History  . Not on file.   Social History Main Topics  . Smoking status: Former Smoker    Packs/day: 0.25    Years: 20.00    Types: Cigarettes    Quit date: 08/31/1995  . Smokeless tobacco: Never Used  . Alcohol use Not on file  . Drug use: Unknown  . Sexual activity: Not on file   Other Topics Concern  . Not on file   Social History Narrative  . No narrative on file     Allergies  Allergen Reactions  . Ace Inhibitors Swelling  . Lisinopril Swelling     Outpatient Medications Prior to Visit  Medication Sig Dispense Refill  . albuterol (PROVENTIL HFA;VENTOLIN HFA) 108 (90 Base) MCG/ACT inhaler Inhale 1-2 puffs into the lungs every 6 (six) hours as needed for wheezing or shortness of breath.    Marland Kitchen amLODipine (NORVASC) 10 MG tablet Take 10 mg by mouth daily.    Marland Kitchen atenolol (TENORMIN) 50 MG tablet Take 50 mg by mouth daily.    Marland Kitchen atorvastatin (LIPITOR) 80 MG tablet Take 80 mg by mouth daily.    . dorzolamide-timolol (COSOPT) 22.3-6.8 MG/ML ophthalmic solution Place 1 drop into both eyes 2 (two) times daily.    . furosemide (LASIX) 20 MG tablet Take 1 tablet (20 mg total) by mouth daily as needed. 30 tablet 2  . glucose blood test strip 1 each by Other route as needed for other (blood glucose). Use as instructed    . metFORMIN (GLUCOPHAGE) 500 MG tablet Take 500 mg by mouth 2 (two) times  daily with a meal.    . pantoprazole (PROTONIX) 40 MG tablet Take 1 tablet (40 mg total) by mouth 2 (two) times daily. 60 tablet 5  . tiotropium (SPIRIVA) 18 MCG inhalation capsule Place 1 capsule (18 mcg total) into inhaler and inhale daily. 30 capsule 12  . sucralfate (CARAFATE) 1 GM/10ML suspension Take 10 mLs (1 g total) by mouth 4 (four) times daily -  with meals and at bedtime. 840 mL 0   No facility-administered medications prior to visit.          Objective:   Physical Exam Vitals:   08/13/16 1458 08/13/16 1500  BP:  118/70  Pulse:  80  SpO2:   96%  Weight: 171 lb (77.6 kg)   Height: _0  (1.6 m)    Gen: Pleasant, well-nourished, in no distress,  normal affect  ENT: No lesions,  mouth clear,  oropharynx clear, no postnasal drip  Neck: No JVD, no TMG, no carotid bruits  Lungs: No use of accessory muscles,clear B   Cardiovascular: RRR, heart sounds normal, no murmur or gallops, no peripheral edema  Musculoskeletal: No deformities, no cyanosis or clubbing  Neuro: alert, non focal  Skin: Warm, no lesions or rashes     Assessment & Plan:  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 Severe obstruction based on her recent pulnm function testing. She believes that Spiriva may be helping her. We will continue this, add albuterol when necessary. Continue oxygen as ordered.  Chronic hypoxemic respiratory failure (HCC) Suspect due to obstructive lung disease. Consider also her severe pulmonary hypertension. She may have OSA.   Pulmonary hypertension Suspect largely related to obstructive lung disease. She does also have newly diagnosed disease which will need to be further evaluated. She also may have obstructive sleep apnea and we will evaluate this. We will refer to rheumatology, obtain a polysomnogram. She may merit targeted therapy depending on the workup. I would like to repeat her echocardiogram after we have treated all the identifiable underlying contributors.  Baltazar Apo, MD, PhD 08/13/2016, 3:30 PM [facility] [phone] or if no answer [phone]

## 2016-08-13 NOTE — Patient Instructions (Addendum)
Please continue your spiriva once a day Use albuterol 2 puffs up to every 4 hours if needed for shortness of breath.  We will perform a sleep study  We will refer you to see a Rheumatologist.  We will repeat your echocardiogram in several months after you have been treated for your COPD, low oxygen levels, possible abnormal breathing while sleeping.  Follow with Dr Lamonte Sakai in 3 months or sooner if you have any problems.

## 2016-08-16 ENCOUNTER — Encounter: Payer: Self-pay | Admitting: Oncology

## 2016-08-16 ENCOUNTER — Telehealth: Payer: Self-pay | Admitting: Emergency Medicine

## 2016-08-16 ENCOUNTER — Encounter (INDEPENDENT_AMBULATORY_CARE_PROVIDER_SITE_OTHER): Payer: Self-pay

## 2016-08-16 ENCOUNTER — Ambulatory Visit (INDEPENDENT_AMBULATORY_CARE_PROVIDER_SITE_OTHER): Payer: Medicare Other | Admitting: Oncology

## 2016-08-16 ENCOUNTER — Other Ambulatory Visit: Payer: Self-pay

## 2016-08-16 VITALS — BP 166/72 | HR 84 | Temp 97.9°F | Ht 63.0 in | Wt 172.9 lb

## 2016-08-16 DIAGNOSIS — D649 Anemia, unspecified: Secondary | ICD-10-CM

## 2016-08-16 DIAGNOSIS — E1139 Type 2 diabetes mellitus with other diabetic ophthalmic complication: Secondary | ICD-10-CM

## 2016-08-16 DIAGNOSIS — Z87891 Personal history of nicotine dependence: Secondary | ICD-10-CM

## 2016-08-16 DIAGNOSIS — Z841 Family history of disorders of kidney and ureter: Secondary | ICD-10-CM

## 2016-08-16 DIAGNOSIS — I272 Pulmonary hypertension, unspecified: Secondary | ICD-10-CM

## 2016-08-16 DIAGNOSIS — Z888 Allergy status to other drugs, medicaments and biological substances status: Secondary | ICD-10-CM

## 2016-08-16 DIAGNOSIS — K922 Gastrointestinal hemorrhage, unspecified: Secondary | ICD-10-CM

## 2016-08-16 DIAGNOSIS — D638 Anemia in other chronic diseases classified elsewhere: Secondary | ICD-10-CM | POA: Diagnosis not present

## 2016-08-16 DIAGNOSIS — Z833 Family history of diabetes mellitus: Secondary | ICD-10-CM

## 2016-08-16 DIAGNOSIS — Z8249 Family history of ischemic heart disease and other diseases of the circulatory system: Secondary | ICD-10-CM

## 2016-08-16 DIAGNOSIS — I10 Essential (primary) hypertension: Secondary | ICD-10-CM

## 2016-08-16 DIAGNOSIS — D599 Acquired hemolytic anemia, unspecified: Secondary | ICD-10-CM

## 2016-08-16 DIAGNOSIS — Z7984 Long term (current) use of oral hypoglycemic drugs: Secondary | ICD-10-CM

## 2016-08-16 DIAGNOSIS — Z90711 Acquired absence of uterus with remaining cervical stump: Secondary | ICD-10-CM

## 2016-08-16 DIAGNOSIS — Z9981 Dependence on supplemental oxygen: Secondary | ICD-10-CM

## 2016-08-16 DIAGNOSIS — Z9049 Acquired absence of other specified parts of digestive tract: Secondary | ICD-10-CM

## 2016-08-16 DIAGNOSIS — J449 Chronic obstructive pulmonary disease, unspecified: Secondary | ICD-10-CM

## 2016-08-16 DIAGNOSIS — H0520 Unspecified exophthalmos: Secondary | ICD-10-CM

## 2016-08-16 DIAGNOSIS — M359 Systemic involvement of connective tissue, unspecified: Secondary | ICD-10-CM | POA: Diagnosis not present

## 2016-08-16 DIAGNOSIS — E785 Hyperlipidemia, unspecified: Secondary | ICD-10-CM

## 2016-08-16 DIAGNOSIS — H409 Unspecified glaucoma: Secondary | ICD-10-CM

## 2016-08-16 HISTORY — DX: Unspecified exophthalmos: H05.20

## 2016-08-16 LAB — SAVE SMEAR

## 2016-08-16 MED ORDER — ALBUTEROL SULFATE HFA 108 (90 BASE) MCG/ACT IN AERS: 1.0000 | INHALATION_SPRAY | RESPIRATORY_TRACT | 1 refills | Status: DC | PRN

## 2016-08-16 NOTE — Telephone Encounter (Signed)
Spoke with daughter and apologized for the rx not being at the pharmacy, a rx was sent to her pharmacy of choice. Nothing further is needed at this time.     Prior Versions: 1. Collene Gobble, MD (Physician) at 08/13/2016 3:22 PM - Signed    Please continue your spiriva once a day Use albuterol 2 puffs up to every 4 hours if needed for shortness of breath.  We will perform a sleep study  We will refer you to see a Rheumatologist.  We will repeat your echocardiogram in several months after you have been treated for your COPD, low oxygen levels, possible abnormal breathing while sleeping.  Follow with Dr Lamonte Sakai in 3 months or sooner if you have any problems.

## 2016-08-16 NOTE — Patient Instructions (Signed)
Return visit 6-8 weeks

## 2016-08-16 NOTE — Telephone Encounter (Signed)
lmom tcb x1

## 2016-08-16 NOTE — Telephone Encounter (Signed)
Daughter calling back [phone]

## 2016-08-17 LAB — CBC WITH DIFFERENTIAL/PLATELET
Basophils Absolute: 0 10*3/uL (ref 0.0–0.2)
Basos: 0 %
EOS (ABSOLUTE): 0.2 10*3/uL (ref 0.0–0.4)
Eos: 5 %
HEMATOCRIT: 29.5 % — AB (ref 34.0–46.6)
Hemoglobin: 9.5 g/dL — ABNORMAL LOW (ref 11.1–15.9)
IMMATURE GRANULOCYTES: 0 %
Immature Grans (Abs): 0 10*3/uL (ref 0.0–0.1)
Lymphocytes Absolute: 1.1 10*3/uL (ref 0.7–3.1)
Lymphs: 21 %
MCH: 27.8 pg (ref 26.6–33.0)
MCHC: 32.2 g/dL (ref 31.5–35.7)
MCV: 86 fL (ref 79–97)
MONOS ABS: 0.3 10*3/uL (ref 0.1–0.9)
Monocytes: 5 %
NEUTROS PCT: 69 %
Neutrophils Absolute: 3.4 10*3/uL (ref 1.4–7.0)
Platelets: 272 10*3/uL (ref 150–379)
RBC: 3.42 x10E6/uL — AB (ref 3.77–5.28)
RDW: 19 % — AB (ref 12.3–15.4)
WBC: 5 10*3/uL (ref 3.4–10.8)

## 2016-08-17 LAB — HAPTOGLOBIN: Haptoglobin: <10 mg/dL — ABNORMAL LOW (ref 34–200)

## 2016-08-17 LAB — RETICULOCYTES: RETIC CT PCT: 2.1 % (ref 0.6–2.6)

## 2016-08-17 LAB — ANTINUCLEAR ANTIBODIES, IFA: ANA TITER 1: POSITIVE — AB

## 2016-08-17 LAB — FANA STAINING PATTERNS: CENTROMERE AB: 1:640 {titer} — ABNORMAL HIGH

## 2016-08-17 LAB — LACTATE DEHYDROGENASE: LDH: 221 IU/L (ref 119–226)

## 2016-08-18 LAB — TSH: TSH: 2.04 u[IU]/mL (ref 0.450–4.500)

## 2016-08-18 LAB — SPECIMEN STATUS REPORT

## 2016-08-18 NOTE — Progress Notes (Signed)
New Patient Hematology   Kathleen Becker [id number] 1946-03-12 70 y.o. 08/18/2016  XQ:JJHERDEY Eller, Pieter Partridge, N>C>; Herbie Baltimore Buccini; Baltazar Apo   Reason for referral: follow up complex anemia post recent hospitalization   HPI:  Kathleen Becker 70 year old woman I recently attended on the inpatient medicine service when she presented a few days after a recent hospitalization at [facility] then transferred to  [place] where she was evaluated for acute, hypoxemic, respiratory failure. She is a former smoker who stopped about 3 years ago. She was found to have new diagnoses pulmonary hypertension with PA pressure 70 mm and diastolic heart failure.Outpatient pulmonary consultation planned but due to persistent dyspnea and melanic stools, her daughter who lives in [place], brought her here. See H&P & D/C summary for details. Initial Hb was 6.6 and stool was guaic positive She had been on 81 mg of ASA daily. No NSAIDS. No EtOh. No hx of PUD. She was transfused to a stable Hb. In view of unstable cardiopulmonary status, GI consultant did not feel endoscopy indicated.Working dx was ASA induced gastritis.No gross bleeding observed and Hb remained stable post transfusion. ASA stopped. PPI started.  She was seen by pulmonary.Although a positive ANA, centromeric pattern, was found and primary pulmonary hypertension was entertained, PFTs showed severe obstructive disease with FEV1 30% of normal documenting significant obstructive pulmonary disease. Oxygen therapy was initiated and continued after discharge. Anemia was normochromic and ferritin low normal at 30 despite OB positive stool. 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 ANA positive 1:160 centromeric, RF negative, Anti DNS ds negative, ANCA negative, Sjogren's & scleroderma panels negative, HIV negative. G6PD normal.  There is no  family history of anemia or a bleeding disorder.    PMH: Past Medical History:  Diagnosis Date  . Diabetes mellitus without complication ([place])   . 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 High cholesterol   . Hypertension      Past surgical history:  C section with her 1st and only child. Required blood transfusion. Partial hysterectomy.Appendectomy, T&A as a child.  Allergies: Allergies  Allergen Reactions  . Ace Inhibitors Swelling, anaphylaxis; throat closed, almost needed a trach  . Lisinopril     Medications:  Current Outpatient Prescriptions:  .  albuterol (PROVENTIL HFA;VENTOLIN HFA) 108 (90 Base) MCG/ACT inhaler, Inhale 1-2 puffs into the lungs every 4 (four) hours as needed for wheezing or shortness of breath., Disp: 1 Inhaler, Rfl: 1 .  amLODipine (NORVASC) 10 MG tablet, Take 10 mg by mouth daily., Disp: , Rfl:  .  atenolol (TENORMIN) 50 MG tablet, Take 50 mg by mouth daily., Disp: , Rfl:  .  atorvastatin (LIPITOR) 80 MG tablet, Take 80 mg by mouth daily., Disp: , Rfl:  .  dorzolamide-timolol (COSOPT) 22.3-6.8 MG/ML ophthalmic solution, Place 1 drop into both eyes 2 (two) times daily., Disp: , Rfl:  .  furosemide (LASIX) 20 MG tablet, Take 1 tablet (20 mg total) by mouth daily as needed., Disp: 30 tablet, Rfl: 2 .  glucose blood test strip, 1 each by Other route as needed for other (blood glucose). Use as instructed, Disp: , Rfl:  .  metFORMIN (GLUCOPHAGE) 500 MG tablet, Take 500 mg by mouth 2 (two) times daily with a meal., Disp: , Rfl:  .  pantoprazole (PROTONIX) 40 MG tablet, Take 1 tablet (40 mg total) by mouth 2 (two) times daily., Disp: 60 tablet, Rfl: 5 .  tiotropium (SPIRIVA) 18 MCG inhalation capsule, Place 1 capsule (18 mcg total) into inhaler and inhale daily., Disp: 30 capsule, Rfl: 12  Social History: Retired Automotive engineer. Divorced. Never remarried  reports that she quit smoking about 3 years ago. she does not drink alcohol or use drugs. 1  daughter, also a Pharmacist, hospital.  Family History: Father died age 63 of an MI; mother died age 73 of kidney failure; 1 brother died age 54 complications of diabetes. No other sibs.  Review of Systems: See HPI Remaining ROS negative.  Physical Exam: Blood pressure (!) 166/72, pulse 84, temperature 97.9 F (36.6 C), temperature source Oral, height _0  (1.6 m), weight 172 lb 14.4 oz (78.4 kg), SpO2 (!) 89 %. Wt Readings from Last 3 Encounters:  08/16/16 172 lb 14.4 oz (78.4 kg)  08/13/16 171 lb (77.6 kg)  07/16/16 166 lb 6.4 oz (75.5 kg)     General appearance: Kathleen Becker, well nourished, AA female HENNT: bilateral exophthalmosPharynx no erythema, exudate, mass, or ulcer. No thyromegaly or thyroid nodules Lymph nodes: No cervical, supraclavicular, or axillary lymphadenopathy Breasts: Lungs: Clear to auscultation, resonant to percussion throughout Heart: Regular rhythm, no murmur, no gallop, no rub, no click, no edema Abdomen: Soft, nontender, normal bowel sounds, no mass, no organomegaly Extremities: 1-2+ ankle edema, no calf tenderness Musculoskeletal: no joint deformities GU:  Vascular: Carotid pulses 2+, no bruits, distal pulses: Dorsalis pedis 1+ symmetric Neurologic: Alert, oriented, right pupil distorted, not reactive to light from prior injury, left pupil round, reaactive optic discs sharp and vessels normal, no hemorrhage or exudate, cranial nerves grossly normal, motor strength 5 over 5, reflexes 1+ symmetric, upper body coordination normal, gait normal, Skin: No rash or ecchymosis    Lab Results: Lab Results  Component Value Date   WBC 5.0 08/16/2016   HGB 9.4 (L) 07/16/2016   HCT 29.5 (L) 08/16/2016   MCV 86 08/16/2016   PLT 272 08/16/2016     Chemistry      Component Value Date/Time   NA 139 07/16/2016 0306   K 3.2 (L) 07/16/2016 0306   CL 99 (L) 07/16/2016 0306   CO2 32 07/16/2016 0306   BUN 10 07/16/2016 0306   CREATININE 1.22 (H) 07/16/2016 0306       Component Value Date/Time   CALCIUM 9.2 07/16/2016 0306   ALKPHOS 56 07/13/2016 0829   AST 25 07/13/2016 0829   ALT 30 07/13/2016 0829   BILITOT 1.1 07/13/2016 0829     Retic count 2.1%; LDH 221 nl <226; haptoglobin<10; ANA positive 1:640 TSH 2.02 Osmotic fragility & pyruvate kinase pending  Review of peripheral blood film: when in hospital:  Normochromic red cells,2+ polychromasia, 1-2+ spherocytes, no schistocytes or target cells,   Radiological Studies: cardiomegaly and vascular congestion on CXR in hospital    Impression: Mutifactorial Normochromic anemia: 1. Subacute GI blood loss. Presenting Hb 6.6 with non reproducible stool occult blood positive. She just saw GI last week and stool negative on digital rectal exam. Hemoglobin stable since hospital discharge.  Consider Cologuard stool test since not stable for conscious sedation due to Cardiopulmonary disease. 2. Previously undiagnosed collagen vascular disease: high positive ANA, centromeric pattern with negative anti ds DNA, Sjogren's panel, & scleroderma serology. No monoclonal gammopathy. 3. 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 G6PD normal. I am checking PK enzyme & osmotic fragility test. No role for steroids at this point  but it will be interesting to see what her hemoglobin does if she gets a trial of steroids for her COPD. Continue to monitor labs. No additional specific treatment at this time. I do not feel a bone marrow biopsy will add any insight at this time. 4. 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  5. Pulmonary HTN: also complex. Primary and secondary components. 6. Exophthalmos. Clinically euthyroid and normal TSH 7. Type 2 diabetes on oral agent8. Essential HTN 8. COPD 9. Glaucoma 10. Hyperlipidemia    Murriel Hopper,  MD, FACP  Hematology-Oncology/Internal Medicine  08/18/2016, 10:22 AM

## 2016-08-19 LAB — PYRUVATE KINASE: PYRUVATE KINASE (PK): 6.1 U/g{Hb} (ref 4.6–11.2)

## 2016-08-20 LAB — RBC OSMOTIC FRAGILITY

## 2016-08-25 ENCOUNTER — Other Ambulatory Visit: Payer: Self-pay

## 2016-08-25 ENCOUNTER — Inpatient Hospital Stay (HOSPITAL_COMMUNITY)
Admission: EM | Admit: 2016-08-25 | Discharge: 2016-08-27 | DRG: 291 | Disposition: A | Discharge status: Home / Self Care | Payer: Medicare Other | Attending: Oncology | Admitting: Oncology

## 2016-08-25 ENCOUNTER — Encounter (HOSPITAL_COMMUNITY): Payer: Self-pay

## 2016-08-25 ENCOUNTER — Emergency Department (HOSPITAL_COMMUNITY): Payer: Medicare Other

## 2016-08-25 DIAGNOSIS — I5030 Unspecified diastolic (congestive) heart failure: Secondary | ICD-10-CM

## 2016-08-25 DIAGNOSIS — E782 Mixed hyperlipidemia: Secondary | ICD-10-CM | POA: Diagnosis present

## 2016-08-25 DIAGNOSIS — Z7984 Long term (current) use of oral hypoglycemic drugs: Secondary | ICD-10-CM | POA: Diagnosis not present

## 2016-08-25 DIAGNOSIS — E876 Hypokalemia: Secondary | ICD-10-CM | POA: Diagnosis present

## 2016-08-25 DIAGNOSIS — E872 Acidosis: Secondary | ICD-10-CM | POA: Diagnosis present

## 2016-08-25 DIAGNOSIS — E875 Hyperkalemia: Secondary | ICD-10-CM | POA: Diagnosis present

## 2016-08-25 DIAGNOSIS — M359 Systemic involvement of connective tissue, unspecified: Secondary | ICD-10-CM | POA: Diagnosis present

## 2016-08-25 DIAGNOSIS — I2723 Pulmonary hypertension due to lung diseases and hypoxia: Secondary | ICD-10-CM

## 2016-08-25 DIAGNOSIS — E1122 Type 2 diabetes mellitus with diabetic chronic kidney disease: Secondary | ICD-10-CM | POA: Diagnosis present

## 2016-08-25 DIAGNOSIS — Z9981 Dependence on supplemental oxygen: Secondary | ICD-10-CM | POA: Diagnosis not present

## 2016-08-25 DIAGNOSIS — E669 Obesity, unspecified: Secondary | ICD-10-CM | POA: Diagnosis present

## 2016-08-25 DIAGNOSIS — D589 Hereditary hemolytic anemia, unspecified: Secondary | ICD-10-CM | POA: Diagnosis present

## 2016-08-25 DIAGNOSIS — N179 Acute kidney failure, unspecified: Secondary | ICD-10-CM | POA: Diagnosis present

## 2016-08-25 DIAGNOSIS — I5033 Acute on chronic diastolic (congestive) heart failure: Secondary | ICD-10-CM | POA: Diagnosis present

## 2016-08-25 DIAGNOSIS — I509 Heart failure, unspecified: Secondary | ICD-10-CM

## 2016-08-25 DIAGNOSIS — J449 Chronic obstructive pulmonary disease, unspecified: Secondary | ICD-10-CM | POA: Diagnosis present

## 2016-08-25 DIAGNOSIS — I270 Primary pulmonary hypertension: Secondary | ICD-10-CM | POA: Diagnosis present

## 2016-08-25 DIAGNOSIS — R0602 Shortness of breath: Secondary | ICD-10-CM | POA: Diagnosis present

## 2016-08-25 DIAGNOSIS — Z6830 Body mass index (BMI) 30.0-30.9, adult: Secondary | ICD-10-CM

## 2016-08-25 DIAGNOSIS — D594 Other nonautoimmune hemolytic anemias: Secondary | ICD-10-CM

## 2016-08-25 DIAGNOSIS — H409 Unspecified glaucoma: Secondary | ICD-10-CM | POA: Diagnosis present

## 2016-08-25 DIAGNOSIS — J9621 Acute and chronic respiratory failure with hypoxia: Secondary | ICD-10-CM | POA: Diagnosis present

## 2016-08-25 DIAGNOSIS — I130 Hypertensive heart and chronic kidney disease with heart failure and stage 1 through stage 4 chronic kidney disease, or unspecified chronic kidney disease: Secondary | ICD-10-CM | POA: Diagnosis present

## 2016-08-25 DIAGNOSIS — Z79899 Other long term (current) drug therapy: Secondary | ICD-10-CM

## 2016-08-25 DIAGNOSIS — Z888 Allergy status to other drugs, medicaments and biological substances status: Secondary | ICD-10-CM | POA: Diagnosis not present

## 2016-08-25 DIAGNOSIS — Z87891 Personal history of nicotine dependence: Secondary | ICD-10-CM

## 2016-08-25 DIAGNOSIS — N183 Chronic kidney disease, stage 3 (moderate): Secondary | ICD-10-CM | POA: Diagnosis present

## 2016-08-25 DIAGNOSIS — J9611 Chronic respiratory failure with hypoxia: Secondary | ICD-10-CM | POA: Diagnosis not present

## 2016-08-25 LAB — BASIC METABOLIC PANEL WITH GFR
Anion gap: 10 (ref 5–15)
BUN: 16 mg/dL (ref 6–20)
CO2: 24 mmol/L (ref 22–32)
Calcium: 9.3 mg/dL (ref 8.9–10.3)
Chloride: 100 mmol/L — ABNORMAL LOW (ref 101–111)
Creatinine, Ser: 1.48 mg/dL — ABNORMAL HIGH (ref 0.44–1.00)
GFR calc Af Amer: 40 mL/min — ABNORMAL LOW
GFR calc non Af Amer: 34 mL/min — ABNORMAL LOW
Glucose, Bld: 153 mg/dL — ABNORMAL HIGH (ref 65–99)
Potassium: 5.2 mmol/L — ABNORMAL HIGH (ref 3.5–5.1)
Sodium: 134 mmol/L — ABNORMAL LOW (ref 135–145)

## 2016-08-25 LAB — CBC WITH DIFFERENTIAL/PLATELET
Basophils Absolute: 0 K/uL (ref 0.0–0.1)
Basophils Relative: 0 %
Eosinophils Absolute: 0.1 K/uL (ref 0.0–0.7)
Eosinophils Relative: 1 %
HCT: 34 % — ABNORMAL LOW (ref 36.0–46.0)
Hemoglobin: 10.4 g/dL — ABNORMAL LOW (ref 12.0–15.0)
Lymphocytes Relative: 11 %
Lymphs Abs: 0.9 K/uL (ref 0.7–4.0)
MCH: 27.5 pg (ref 26.0–34.0)
MCHC: 30.6 g/dL (ref 30.0–36.0)
MCV: 89.9 fL (ref 78.0–100.0)
Monocytes Absolute: 0.3 K/uL (ref 0.1–1.0)
Monocytes Relative: 3 %
Neutro Abs: 7 K/uL (ref 1.7–7.7)
Neutrophils Relative %: 85 %
Platelets: 308 K/uL (ref 150–400)
RBC: 3.78 MIL/uL — ABNORMAL LOW (ref 3.87–5.11)
RDW: 19.8 % — ABNORMAL HIGH (ref 11.5–15.5)
WBC: 8.4 K/uL (ref 4.0–10.5)

## 2016-08-25 LAB — I-STAT ARTERIAL BLOOD GAS, ED
Acid-base deficit: 1 mmol/L (ref 0.0–2.0)
Bicarbonate: 25.1 mmol/L (ref 20.0–28.0)
O2 Saturation: 91 %
PCO2 ART: 50 mmHg — AB (ref 32.0–48.0)
PH ART: 7.309 — AB (ref 7.350–7.450)
PO2 ART: 68 mmHg — AB (ref 83.0–108.0)
TCO2: 27 mmol/L (ref 0–100)

## 2016-08-25 LAB — BRAIN NATRIURETIC PEPTIDE: B Natriuretic Peptide: 1516.4 pg/mL — ABNORMAL HIGH (ref 0.0–100.0)

## 2016-08-25 LAB — I-STAT TROPONIN, ED: Troponin i, poc: 0.01 ng/mL (ref 0.00–0.08)

## 2016-08-25 MED ORDER — INSULIN ASPART 100 UNIT/ML ~~LOC~~ SOLN: 0.0000 [IU] | Freq: Three times a day (TID) | SUBCUTANEOUS | Status: DC

## 2016-08-25 MED ORDER — FUROSEMIDE 10 MG/ML IJ SOLN
80.0000 mg | INTRAMUSCULAR | Status: AC
  Administered 2016-08-25: 80 mg via INTRAVENOUS
  Filled 2016-08-25: qty 8

## 2016-08-25 MED ORDER — PANTOPRAZOLE SODIUM 40 MG PO TBEC
40.0000 mg | DELAYED_RELEASE_TABLET | Freq: Two times a day (BID) | ORAL | Status: DC
  Administered 2016-08-25 – 2016-08-27 (×4): 40 mg via ORAL
  Filled 2016-08-25 (×4): qty 1

## 2016-08-25 MED ORDER — SODIUM CHLORIDE 0.9% FLUSH: 3.0000 mL | INTRAVENOUS | Status: DC | PRN

## 2016-08-25 MED ORDER — DORZOLAMIDE HCL-TIMOLOL MAL 2-0.5 % OP SOLN
1.0000 [drp] | Freq: Two times a day (BID) | OPHTHALMIC | Status: DC
  Filled 2016-08-25: qty 10

## 2016-08-25 MED ORDER — METOCLOPRAMIDE HCL 5 MG/ML IJ SOLN: 10.0000 mg | Freq: Once | INTRAMUSCULAR | Status: DC

## 2016-08-25 MED ORDER — FUROSEMIDE 10 MG/ML IJ SOLN
80.0000 mg | Freq: Once | INTRAMUSCULAR | Status: AC
  Administered 2016-08-25: 80 mg via INTRAVENOUS
  Filled 2016-08-25: qty 8

## 2016-08-25 MED ORDER — MORPHINE SULFATE (PF) 4 MG/ML IV SOLN
4.0000 mg | INTRAVENOUS | Status: DC | PRN
Start: 2016-08-25 — End: 2016-08-25

## 2016-08-25 MED ORDER — SODIUM CHLORIDE 0.9 % IV BOLUS (SEPSIS): 500.0000 mL | Freq: Once | INTRAVENOUS | Status: DC

## 2016-08-25 MED ORDER — ATORVASTATIN CALCIUM 80 MG PO TABS
80.0000 mg | ORAL_TABLET | Freq: Every day | ORAL | Status: DC
Start: 2016-08-25 — End: 2016-08-27
  Administered 2016-08-25 – 2016-08-27 (×3): 80 mg via ORAL
  Filled 2016-08-25 (×3): qty 1

## 2016-08-25 MED ORDER — ONDANSETRON HCL 4 MG/2ML IJ SOLN: 4.0000 mg | Freq: Four times a day (QID) | INTRAMUSCULAR | Status: DC | PRN

## 2016-08-25 MED ORDER — ENOXAPARIN SODIUM 40 MG/0.4ML ~~LOC~~ SOLN
40.0000 mg | Freq: Every day | SUBCUTANEOUS | Status: DC
  Administered 2016-08-26 – 2016-08-27 (×2): 40 mg via SUBCUTANEOUS
  Filled 2016-08-25 (×2): qty 0.4

## 2016-08-25 MED ORDER — IPRATROPIUM-ALBUTEROL 0.5-2.5 (3) MG/3ML IN SOLN
3.0000 mL | Freq: Four times a day (QID) | RESPIRATORY_TRACT | Status: DC
  Administered 2016-08-25 – 2016-08-26 (×5): 3 mL via RESPIRATORY_TRACT
  Filled 2016-08-25 (×4): qty 3

## 2016-08-25 MED ORDER — SODIUM CHLORIDE 0.9 % IV SOLN: 250.0000 mL | INTRAVENOUS | Status: DC | PRN

## 2016-08-25 MED ORDER — ACETAMINOPHEN 325 MG PO TABS: 650.0000 mg | ORAL_TABLET | ORAL | Status: DC | PRN

## 2016-08-25 MED ORDER — ATENOLOL 25 MG PO TABS
50.0000 mg | ORAL_TABLET | Freq: Every day | ORAL | Status: DC
  Administered 2016-08-25 – 2016-08-27 (×3): 50 mg via ORAL
  Filled 2016-08-25: qty 2
  Filled 2016-08-25: qty 1
  Filled 2016-08-25: qty 2

## 2016-08-25 MED ORDER — SODIUM CHLORIDE 0.9% FLUSH
3.0000 mL | Freq: Two times a day (BID) | INTRAVENOUS | Status: DC
  Administered 2016-08-25 – 2016-08-27 (×3): 3 mL via INTRAVENOUS

## 2016-08-25 MED ORDER — SODIUM POLYSTYRENE SULFONATE 15 GM/60ML PO SUSP
15.0000 g | Freq: Once | ORAL | Status: AC
  Administered 2016-08-25: 15 g via ORAL
  Filled 2016-08-25: qty 60

## 2016-08-25 MED ORDER — AMLODIPINE BESYLATE 10 MG PO TABS
10.0000 mg | ORAL_TABLET | Freq: Every day | ORAL | Status: DC
  Administered 2016-08-25 – 2016-08-27 (×3): 10 mg via ORAL
  Filled 2016-08-25 (×2): qty 1
  Filled 2016-08-25: qty 2

## 2016-08-25 NOTE — ED Triage Notes (Signed)
Pt presents for SOB/difficulty breathing starting last night. Pt. States wears 2-4L O2 at home PRN. Per pt. Requiring O2 more frequently. Pt breathing labored, dyspnea at rest in triage. Pt denies CP/cardiac hx. Pt AxO x4.

## 2016-08-25 NOTE — ED Notes (Signed)
Pt O2 sats reading 22% in triage. Pt. Escorted to room, Dr Jeneen Rinks at bedside.

## 2016-08-25 NOTE — ED Notes (Signed)
RN attempted IV 2x unsuccessfully. Will have another RN attempt.

## 2016-08-25 NOTE — ED Notes (Signed)
Pt. weened down to 6L oxygen via [place] and maintaining her oxygen saturation 93%. RN also added humidification to nasal cannula.

## 2016-08-25 NOTE — H&P (Signed)
Date: 08/25/2016               Patient Name:  Kathleen Becker MRN: [id number]  DOB: [birth year] Age / Sex: 70 y.o., female   PCP: Kathleen Sherril Cong, MD         Medical Service: Internal Medicine Teaching Service         Attending Physician: Dr. Annia Belt, MD    First Contact: Dr. Ophelia Shoulder Pager: [phone]  Second Contact: Dr. Berline Lopes Pager: [phone]       After Hours (After 5p/  First Contact Pager: [phone]  weekends / holidays): Second Contact Pager: [phone]   Chief Complaint: Shortness of breath  History of Present Illness: Mrs. 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 The patient reports that she got acutely worse of breath in the early hours of this morning. She endorses mild increase in cough but no sputum production. She denies chest pain. She denies nausea, vomiting or abdominal pain. She denies fevers or chills. She denies recent sick contacts. She denies diarrhea. She denies new dietary changes. She has no additional acute complaints.  In the emergency department the patient was found to be afebrile but in respiratory distress. She was found to have low oxygen saturation and was placed on a nonrebreather. Her respiratory status subsequently improved and she was weaned to 6 L oxygen via nasal cannula with an oxygen saturation of approximately 93%. She then again decompensated and was placed on BiPAP. Laboratory evaluation was significant for an anemia with a hemoglobin of 10.4, hyperkalemia with potassium of 5.2, creatinine of 1.48 and a BNP of 1516. I-STAT troponin was negative. ABG showed a respiratory acidosis with  pH of 7.3, PCO2 of 50 and PO2 of 68. In addition to providing respiratory support she was given an 80 mg furosemide injection and admitted to the [facility] internal medicine teaching service for further management.  Meds:  Current Meds  Medication Sig  . albuterol (PROVENTIL HFA;VENTOLIN HFA) 108 (90 Base) MCG/ACT inhaler Inhale 1-2 puffs into the lungs every 4 (four) hours as needed for wheezing or shortness of breath.  Marland Kitchen amLODipine (NORVASC) 10 MG tablet Take 10 mg by mouth daily.  Marland Kitchen atenolol (TENORMIN) 50 MG tablet Take 50 mg by mouth daily.  Marland Kitchen atorvastatin (LIPITOR) 80 MG tablet Take 80 mg by mouth daily.  . dorzolamide-timolol (COSOPT) 22.3-6.8 MG/ML ophthalmic solution Place 1 drop into both eyes 2 (two) times daily.  . furosemide (LASIX) 20 MG tablet Take 1 tablet (20 mg total) by mouth daily as needed.  Marland Kitchen glucose blood test strip 1 each by Other route as needed for other (blood glucose). Use as instructed  . metFORMIN (GLUCOPHAGE) 500 MG tablet Take 500 mg by mouth 2 (two) times daily with a meal.  . OVER THE COUNTER MEDICATION Take 1 capsule by mouth daily. "Maxivision" eye vitamin  . pantoprazole (PROTONIX) 40 MG tablet Take 1 tablet (40 mg total) by mouth 2 (two) times daily.  Marland Kitchen tiotropium (SPIRIVA) 18 MCG inhalation capsule Place 1 capsule (18 mcg total) into inhaler and inhale daily.     Allergies: Allergies as of 08/25/2016 - Review Complete 08/25/2016  Allergen Reaction Noted  .  Ace inhibitors Swelling 07/13/2016  . Lisinopril Swelling 07/13/2016   Past Medical History:  Diagnosis Date  . Diabetes mellitus without complication ([place])   . 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 High cholesterol   . Hypertension     Family History: No family history on file.   Social History: Recently quit smoking.  Review of Systems: A complete ROS was negative except as per HPI.   Physical Exam: Blood pressure 154/63, pulse 70, temperature 98.5 F (36.9 C), temperature source  Oral, resp. rate 19, SpO2 92 %. Physical Exam  Constitutional: She is oriented to person, place, and time. She appears well-developed and well-nourished.  Resting in bed comfortably, BiPAP on  HENT:  Head: Normocephalic and atraumatic.  Cardiovascular: Normal rate and regular rhythm.  Exam reveals no friction rub.   Respiratory: No respiratory distress.  No wheezes or crackles appreciated on exam. Air movement diminished.  GI: Soft. Bowel sounds are normal. She exhibits no distension. There is no tenderness.  Musculoskeletal: She exhibits edema.  Bilateral pedal edema  Neurological: She is alert and oriented to person, place, and time.     EKG: No ST segment elevation  CXR: Effusions and cardiomegaly suggestive of CHF  Assessment & Plan by Problem: Active Problems:   CHF exacerbation [facility])  [name] is a 70 year old female with a complicated medical history including Gold D COPD (on 4L home O2), heart failure with preserved ejection fraction, collagen vascular disorder, chronic hypoxic respiratory failure and anemia who presents with increased shortness of breath.  # Acute on chronic hypoxemic respiratory failure most likely 2/2 acute exacerbation of HFpEF ## COPD GOLD D ## HFpEF ## Pulmonary hypertension The patient has a history of GOLD D COPD on home oxygen (4L) and heart failure with preserved ejection fraction who presents with 1 day history of increased shortness of breath and an elevated BNP. ABG consistent with slight respiratory acidosis. X-ray did not show focal area of consolidation but showed interstitial edema and volume overload. Patient afebrile. No leukocytosis. I suspect the most likely etiology of the patient's presentation is secondary to congestive heart failure exacerbation. Increased bilateral pedal edema present on examination. She was recently given Lasix to use on a when necessary basis but has not taken this medication. She does appear mildly volume  overloaded on examination. We will repeat an echocardiogram during this admission. -- IV furosemide 80 mg 1 in the emergency department, 80 mg IV furosemide at 1800 this evening -- Duo nebs scheduled q6 hours -- Continue home amlodipine -- Continue home atenolol -- Continue atorvastatin -- BMP tomorrow -- Echocardiogram-follow-up with results -- Strict I's and O's   # Acute on chronic kidney injury (CKD stage III) Patient's creatinine at presentation was 1.48. Chart review demonstrates a creatinine in the past ranging from 1.1-1.3. Her acute kidney injury  is most likely secondary to decreased perfusionin the setting of heart failure exacerbation. Currently, she is mildly hyperkalemic. -- Daily BMP -- Kayexalate 15 g 1  # Anemia (multifactorial?) Hemoglobin 10.4 on admission. Hemoglobin has ranged from 9-10.4 over the past month. The exact etiology of the patient's anemia is currently unclear however it may be multifactorial. Based on the patient's past medical history on chart review I do suspect some of her anemia may be secondary to occult GI blood loss. Recent note by hematology suggests there may be a component of nonimmune hemolytic anemia and anemia of chronic disease given a new suspected diagnosis of collagen vascular disorder. For now  we will continue to monitor clinically and transfuse if necessary. -- Monitor clinically  # Type 2 diabetes mellitus ## Obesity  -- Carbohydrate modified diet -- Sliding scale insulin  DVT/PE prophylaxis: Lovenox  FEN/GI: Carb modified Code: Full code   Dispo: Admit patient to Inpatient with expected length of stay greater than 2 midnights.  Signed: Ophelia Shoulder, MD 08/25/2016, 12:11 PM  Pager: [phone]

## 2016-08-25 NOTE — ED Notes (Signed)
Paged IM about weening off of BiPap. RT and RN placed patient on 6L via [place]. Pt. Remaining at ~92% at this time.

## 2016-08-25 NOTE — ED Notes (Signed)
Pt. Given water with EDP approval.  

## 2016-08-25 NOTE — ED Provider Notes (Signed)
[facility] DEPT Provider Note   CSN: [id number] Arrival date & time: 08/25/16  0803     History   Chief Complaint Chief Complaint  Patient presents with  . Shortness of Breath    HPI Kathleen Becker is a 70 y.o. female.  HPI   Patient is a 70 year old female with history of CHF, pulmonary hypertension, DM and HTN who presents the ED from home with complaint of shortness of breath, onset early this morning. Patient reports having worsening shortness of breath that started this morning. Patient notes that she was told by her pulmonologist to wear 2 L nasal cannula as needed for shortness of breath. Patient reports wearing her oxygen at home this morning without relief of symptoms. Denies fever, chills, nasal congestion, rhinorrhea, lightheadedness, dizziness, cough, wheezing, chest pain, abdominal pain, nausea, vomiting, weakness. Patient notes this morning she did notice mild swelling to both of her legs. Patient states she has been taking her home medications as prescribed. She notes was recently prescribed Lasix PRN but denies taking the medication. Patient's daughter at bedside reports she was admitted to the hospital last month for similar episode of shortness of breath where she was diagnosed with pulmonary hypertension. Patient reports following up with her pulmonologist earlier this month and notes her next appointment is not scheduled until February.  PCP- Dr. Jerolyn Center Pulmonologist- Dr. Lamonte Sakai  Past Medical History:  Diagnosis Date  . Diabetes mellitus without complication ([place])   . 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 High cholesterol   . Hypertension     Patient Active Problem List   Diagnosis Date Noted  . CHF exacerbation ([place]) 08/25/2016  . 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 Acute gastrointestinal bleeding   . Pulmonary hypertension   . Acute on chronic systolic right heart failure ([place])   . ACE inhibitor intolerance 07/14/2016  . Essential hypertension  07/14/2016  . Mixed hyperlipidemia 07/14/2016  . Obesity (BMI 30.0-34.9) 07/14/2016  . Type 2 diabetes mellitus ([place]) 07/14/2016  . Chronic angle-closure glaucoma of right eye 07/14/2016  . Hemolytic anemia ([place]) 06/11/2016  . Chronic hypoxemic respiratory failure ([place]) 06/11/2016  . Acute diastolic congestive heart failure ([place]) 06/10/2016  . 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 No pertinent surgical history.  OB History    No data available       Home Medications    Prior to Admission medications   Medication Sig Start Date End Date Taking? Authorizing Provider  albuterol (PROVENTIL HFA;VENTOLIN HFA) 108 (90 Base) MCG/ACT inhaler Inhale 1-2 puffs into the lungs every 4 (four) hours as needed for wheezing or shortness of breath. 08/16/16  Yes Collene Gobble, MD  amLODipine (NORVASC) 10 MG tablet Take 10 mg by mouth daily.   Yes Historical Provider, MD  atenolol (TENORMIN) 50 MG tablet Take 50 mg by mouth daily.   Yes Historical Provider, MD  atorvastatin (LIPITOR) 80 MG tablet Take 80 mg by mouth daily.   Yes Historical Provider, MD  dorzolamide-timolol (COSOPT) 22.3-6.8 MG/ML ophthalmic solution Place 1 drop into both eyes 2 (two) times daily.   Yes Historical Provider, MD  furosemide (LASIX) 20 MG tablet Take 1 tablet (20 mg total) by mouth daily as needed. 07/16/16  Yes Alphonzo Grieve, MD  glucose blood test strip 1 each by Other route as needed for other (blood glucose). Use as instructed   Yes Historical Provider, MD  metFORMIN (GLUCOPHAGE) 500 MG tablet Take 500 mg by  mouth 2 (two) times daily with a meal.   Yes Historical Provider, MD  OVER THE COUNTER MEDICATION Take 1 capsule by mouth daily. "Maxivision" eye vitamin   Yes Historical Provider, MD  pantoprazole (PROTONIX) 40 MG tablet Take 1 tablet (40 mg total) by mouth 2 (two) times daily. 07/16/16  Yes Alphonzo Grieve, MD  tiotropium (SPIRIVA) 18 MCG inhalation capsule Place 1  capsule (18 mcg total) into inhaler and inhale daily. 07/17/16  Yes Alphonzo Grieve, MD    Family History No family history on file.  Social History Social History  Substance Use Topics  . Smoking status: Former Smoker    Packs/day: 0.25    Years: 20.00    Types: Cigarettes    Quit date: 08/31/1995  . Smokeless tobacco: Never Used  . Alcohol use No     Allergies   Ace inhibitors and Lisinopril   Review of Systems Review of Systems  Respiratory: Positive for shortness of breath.   All other systems reviewed and are negative.    Physical Exam Updated Vital Signs BP 154/63   Pulse 70   Temp 98.5 F (36.9 C) (Oral)   Resp 19   SpO2 92%   Physical Exam  Constitutional: She is oriented to person, place, and time. She appears well-developed and well-nourished.  HENT:  Head: Normocephalic and atraumatic.  Mouth/Throat: Uvula is midline, oropharynx is clear and moist and mucous membranes are normal. No oropharyngeal exudate, posterior oropharyngeal edema, posterior oropharyngeal erythema or tonsillar abscesses. No tonsillar exudate.  Eyes: Conjunctivae and EOM are normal. Right eye exhibits no discharge. Left eye exhibits no discharge. No scleral icterus.  Neck: Normal range of motion. Neck supple.  Cardiovascular: Normal rate, regular rhythm, normal heart sounds and intact distal pulses.   Pulmonary/Chest: Effort normal. She has wheezes (end-expiratory wheezing bilaterally). She has no rales. She exhibits no tenderness.  Patient appeared mildly tachypneic with mild increased work of breathing. Once pt was placed on nonrebreather mask she reported significant improvement of her shortness of breath and RR decreased.  Abdominal: Soft. Bowel sounds are normal. She exhibits no distension and no mass. There is no tenderness. There is no rebound and no guarding.  Musculoskeletal: Normal range of motion. She exhibits edema (mild nonpitting edema noted to BLE). She exhibits no  tenderness.  Neurological: She is alert and oriented to person, place, and time.  Skin: Skin is warm and dry. She is not diaphoretic.  Nursing note and vitals reviewed.    ED Treatments / Results  Labs (all labs ordered are listed, but only abnormal results are displayed) Labs Reviewed  CBC WITH DIFFERENTIAL/PLATELET - Abnormal; Notable for the following:       Result Value   RBC 3.78 (*)    Hemoglobin 10.4 (*)    HCT 34.0 (*)    RDW 19.8 (*)    All other components within normal limits  BASIC METABOLIC PANEL - Abnormal; Notable for the following:    Sodium 134 (*)    Potassium 5.2 (*)    Chloride 100 (*)    Glucose, Bld 153 (*)    Creatinine, Ser 1.48 (*)    GFR calc non Af Amer 34 (*)    GFR calc Af Amer 40 (*)    All other components within normal limits  BRAIN NATRIURETIC PEPTIDE - Abnormal; Notable for the following:    B Natriuretic Peptide 1,516.4 (*)    All other components within normal limits  I-STAT ARTERIAL BLOOD GAS, ED -  Abnormal; Notable for the following:    pH, Arterial 7.309 (*)    pCO2 arterial 50.0 (*)    pO2, Arterial 68.0 (*)    All other components within normal limits  BLOOD GAS, ARTERIAL  I-STAT TROPOININ, ED    EKG  EKG Interpretation  Date/Time:  Wednesday August 25 2016 08:10:56 EST Ventricular Rate:  82 PR Interval:  134 QRS Duration: 72 QT Interval:  342 QTC Calculation: 399 R Axis:   68 Text Interpretation:  Normal sinus rhythm Nonspecific T wave abnormality Abnormal ECG Confirmed by Jeneen Rinks  MD, [place] ([id number]) on 08/25/2016 8:14:42 AM Also confirmed by Jeneen Rinks  MD, [place] ([id number]), editor [place], Leda Gauze [phone])  on 08/25/2016 9:16:09 AM       Radiology Dg Chest Portable 1 View  Result Date: 08/25/2016 CLINICAL DATA:  Short of breath, history of pulmonary hypertension EXAM: PORTABLE CHEST 1 VIEW COMPARISON:  Portable chest x-ray of 07/13/2016 FINDINGS: There is little change in poor aeration with probable bilateral effusions and  basilar atelectasis. With cardiomegaly these findings most likely indicate CHF although pneumonia is difficult to exclude. No bony abnormality is seen. IMPRESSION: No significant change in poor aeration with effusions and cardiomegaly suggestive of CHF. Electronically Signed   By: Ivar Drape M.D.   On: 08/25/2016 08:48    Procedures Procedures (including critical care time)  Medications Ordered in ED Medications  furosemide (LASIX) injection 80 mg (80 mg Intravenous Given 08/25/16 1029)     Initial Impression / Assessment and Plan / ED Course  I have reviewed the triage vital signs and the nursing notes.  Pertinent labs & imaging results that were available during my care of the patient were reviewed by me and considered in my medical decision making (see chart for details).  Clinical Course     Patient presents with worsening shortness of breath that started this morning. Reports history of CHF and pulmonary hypertension and notes she is advised to use her home O2 PRN. Reports no relief with 2 L nasal cannula at home this morning. Initial vitals showed O2 sat 22% on 2L [place]. Pt placed on 15L non-rebreathing, O2 sat improved to 97%. RR 28. Remaining vitals stable. On exam pt with mild increased work of breathing, end-expiratory wheezes bilaterally. Mild nonpitting edema noted to BLE.   Chart review shows patient was admitted to the hospital on 11/14 for acute on chronic respiratory failure with hypoxemia secondary to acute diastolic CHF and pulmonary hypertension and was d/c home on 07/15/16. Patient reports being prescribed lasix as needed but denies taking any doses recently.   EKG showed sinus rhythm with no acute ischemic changes. Trop negative. BNP 1,516. CXR showed bilateral effusions and cardiomegaly. Pt given 72m IV lasix. On reevaluation, pt resting in bed comfortably without any signs of respiratory distress of increased work of breathing, pt reports improvement of SOB. O2 sat 97%  on 15L non-re breather. When pt was weened down to 6L [place], O2 sat remained stable at 84-86%. Plan to place pt on Bipap. ABG showed compensated respiratory acidosis. Consulted hospitalist for admission of CHF exacerbation. Orders placed for inpatient stepdown, Dr. GBeryle Beams   Final Clinical Impressions(s) / ED Diagnoses   Final diagnoses:  Acute on chronic congestive heart failure, unspecified congestive heart failure type ([facility]    New Prescriptions New Prescriptions   No medications on file     NNona Dell PA-C 08/25/16 1[place] MD 09/02/16 [phone]

## 2016-08-26 ENCOUNTER — Inpatient Hospital Stay (HOSPITAL_COMMUNITY): Payer: Medicare Other

## 2016-08-26 DIAGNOSIS — E1122 Type 2 diabetes mellitus with diabetic chronic kidney disease: Secondary | ICD-10-CM

## 2016-08-26 DIAGNOSIS — J449 Chronic obstructive pulmonary disease, unspecified: Secondary | ICD-10-CM

## 2016-08-26 DIAGNOSIS — I2723 Pulmonary hypertension due to lung diseases and hypoxia: Secondary | ICD-10-CM

## 2016-08-26 DIAGNOSIS — Z9981 Dependence on supplemental oxygen: Secondary | ICD-10-CM

## 2016-08-26 DIAGNOSIS — I5033 Acute on chronic diastolic (congestive) heart failure: Secondary | ICD-10-CM

## 2016-08-26 DIAGNOSIS — J9611 Chronic respiratory failure with hypoxia: Secondary | ICD-10-CM

## 2016-08-26 DIAGNOSIS — N183 Chronic kidney disease, stage 3 (moderate): Secondary | ICD-10-CM

## 2016-08-26 DIAGNOSIS — M359 Systemic involvement of connective tissue, unspecified: Secondary | ICD-10-CM

## 2016-08-26 DIAGNOSIS — Z7984 Long term (current) use of oral hypoglycemic drugs: Secondary | ICD-10-CM

## 2016-08-26 DIAGNOSIS — D594 Other nonautoimmune hemolytic anemias: Secondary | ICD-10-CM

## 2016-08-26 DIAGNOSIS — I5030 Unspecified diastolic (congestive) heart failure: Secondary | ICD-10-CM

## 2016-08-26 DIAGNOSIS — N179 Acute kidney failure, unspecified: Secondary | ICD-10-CM

## 2016-08-26 DIAGNOSIS — E876 Hypokalemia: Secondary | ICD-10-CM

## 2016-08-26 LAB — GLUCOSE, CAPILLARY
GLUCOSE-CAPILLARY: 126 mg/dL — AB (ref 65–99)
GLUCOSE-CAPILLARY: 154 mg/dL — AB (ref 65–99)
Glucose-Capillary: 129 mg/dL — ABNORMAL HIGH (ref 65–99)

## 2016-08-26 LAB — BASIC METABOLIC PANEL
Anion gap: 10 (ref 5–15)
BUN: 15 mg/dL (ref 6–20)
CO2: 28 mmol/L (ref 22–32)
Calcium: 9.2 mg/dL (ref 8.9–10.3)
Chloride: 98 mmol/L — ABNORMAL LOW (ref 101–111)
Creatinine, Ser: 1.34 mg/dL — ABNORMAL HIGH (ref 0.44–1.00)
GFR calc Af Amer: 45 mL/min — ABNORMAL LOW (ref >60)
GFR calc non Af Amer: 39 mL/min — ABNORMAL LOW (ref >60)
Glucose, Bld: 100 mg/dL — ABNORMAL HIGH (ref 65–99)
Potassium: 3.3 mmol/L — ABNORMAL LOW (ref 3.5–5.1)
SODIUM: 136 mmol/L (ref 135–145)

## 2016-08-26 LAB — HEMOGLOBIN A1C
HEMOGLOBIN A1C: 5.3 % (ref 4.8–5.6)
Mean Plasma Glucose: 105 mg/dL

## 2016-08-26 LAB — MRSA PCR SCREENING: MRSA by PCR: NEGATIVE

## 2016-08-26 MED ORDER — IPRATROPIUM-ALBUTEROL 0.5-2.5 (3) MG/3ML IN SOLN
3.0000 mL | Freq: Three times a day (TID) | RESPIRATORY_TRACT | Status: DC
  Administered 2016-08-27: 3 mL via RESPIRATORY_TRACT
  Filled 2016-08-26 (×2): qty 3

## 2016-08-26 MED ORDER — DORZOLAMIDE HCL 2 % OP SOLN
1.0000 [drp] | Freq: Two times a day (BID) | OPHTHALMIC | Status: DC
  Administered 2016-08-26 – 2016-08-27 (×3): 1 [drp] via OPHTHALMIC
  Filled 2016-08-26 (×2): qty 10

## 2016-08-26 MED ORDER — POTASSIUM CHLORIDE CRYS ER 20 MEQ PO TBCR
40.0000 meq | EXTENDED_RELEASE_TABLET | Freq: Every day | ORAL | Status: DC
  Administered 2016-08-26: 40 meq via ORAL
  Filled 2016-08-26: qty 2

## 2016-08-26 MED ORDER — TIMOLOL MALEATE 0.5 % OP SOLN
1.0000 [drp] | Freq: Two times a day (BID) | OPHTHALMIC | Status: DC
  Administered 2016-08-26 – 2016-08-27 (×2): 1 [drp] via OPHTHALMIC
  Filled 2016-08-26 (×2): qty 5

## 2016-08-26 MED ORDER — FUROSEMIDE 20 MG PO TABS
20.0000 mg | ORAL_TABLET | Freq: Every day | ORAL | Status: DC
  Administered 2016-08-26 – 2016-08-27 (×2): 20 mg via ORAL
  Filled 2016-08-26 (×2): qty 1

## 2016-08-26 MED ORDER — INSULIN ASPART 100 UNIT/ML ~~LOC~~ SOLN
0.0000 [IU] | Freq: Three times a day (TID) | SUBCUTANEOUS | Status: DC
  Administered 2016-08-26: 2 [IU] via SUBCUTANEOUS

## 2016-08-26 MED ORDER — INSULIN ASPART 100 UNIT/ML ~~LOC~~ SOLN: 0.0000 [IU] | Freq: Every day | SUBCUTANEOUS | Status: DC

## 2016-08-26 NOTE — Progress Notes (Signed)
  Subjective: [name] feels that her breathing has improved significantly today. She is on her home oxygen requirement of 2 L and sating at 97%.   Objective:  Vital signs in last 24 hours: Vitals:   08/26/16 0207 08/26/16 0500 08/26/16 0744 08/26/16 0908  BP: 139/67 (!) 155/65 (!) 142/58   Pulse: 77 81 78   Resp:  19 (!) 21   Temp:  98.2 F (36.8 C) 97.9 F (36.6 C)   TempSrc: Oral Oral Oral   SpO2:  97% 100% 96%  Weight:       Physical Exam  Constitutional: She appears well-developed and well-nourished. No distress.  HENT:  Salivary gland swelling  Dry mucous membranes   Eyes:  Right pupil injury and near blindness, left pupil constricted   Cardiovascular: Normal rate, regular rhythm and intact distal pulses.   No murmur heard. B/l lower extremity edema   Pulmonary/Chest: Effort normal. No respiratory distress. She has no wheezes. She has no rales.  Abdominal: Soft. She exhibits no distension. There is no tenderness.  Skin: She is not diaphoretic.   Assessment/Plan: Pt is a 70 y.o. yo female with a PMHx of diastolic CHF, COPD GOLD D, pulmonary hypertension, chronic hypoxic respiratory failure, non-immune hemolytic anemia who was admitted on 08/25/2016 with symptoms of shortness of breath, which was determined to be secondary to CHF exacerbation.   Active Problems:   CHF exacerbation (HCC)   Acute exacerbation of CHF (congestive heart failure) (HCC)  Acute on chronic diastolic heart failure exacerbation  ECHO 10/13 EF 28-31%, with diastolic dysfunction  Breathing improved significantly with 2 doses of IV lasix 80 mg. At discharge from her prior hospitalization she was started on lasix 20 mg PRN and was monitoring her leg swelling for fluid overload but did not realize that her difficulty breathing was another sign of fluid overload. Given her significant improving and recent echo results in care everywhere, I don't think she needs another echo at this time.  -Started  po Lasix 20 mg daily  -continue daily weights, strict I& Os -continue amlodipine, atenolol, and atorvastatin   Chronic hypoxemic respiratory failure  Multifactorial related to COPD GOLD D, pulmonary HTN (left heart cath 06/17/2016 PA pressure 70) and anemia. She uses 2.5 L oxygen at home at bedtime and 4 L as needed for low pulse ox during the day at home. Initially she required BiPAP in the ED but currently she is sating in the hight 90s on 2 L.   -Wean O2 to room air  -continue duonebs q6h  Hypokalemia  On presentation K 5.2, she received Kayexalate 15g. Today it K 3.3 after 2 doses of IV lasix.  -Started K-Dur 40 meq daily  -Follow up morning BMP   Hemolytic anemia  Hgb 10.4, improved from baseline 9. Stable and asymptomatic. Has had extensive outpatient workup and following with Dr. Beryle Beams outpatient. May consider a steroid trial at outpatient follow up.   Acute on CKD stage 3  Crt improving 1.34 today with baseline 1.1-1.3. Most likely related to decreased perfusion int eh setting of CHF exacerbation.  -Follow up morning BMP   Type 2 DM HbA1c 5.3, well controlled with oral medications outpatient. BG 100 on presentation.  -sensitive insulin sliding scale  Dispo: Anticipated discharge in approximately 1-2 day(s).   Ledell Noss, MD 08/26/2016, 11:20 AM Pager: [phone]

## 2016-08-26 NOTE — Progress Notes (Signed)
Pt arrived on unit with [place] @ 6 LPM. SPO2 WNL. Pt is comfortable and resting. Will continue to monitor.

## 2016-08-27 LAB — BASIC METABOLIC PANEL
Anion gap: 8 (ref 5–15)
BUN: 13 mg/dL (ref 6–20)
CO2: 31 mmol/L (ref 22–32)
Calcium: 8.9 mg/dL (ref 8.9–10.3)
Chloride: 100 mmol/L — ABNORMAL LOW (ref 101–111)
Creatinine, Ser: 1.43 mg/dL — ABNORMAL HIGH (ref 0.44–1.00)
GFR calc Af Amer: 42 mL/min — ABNORMAL LOW (ref >60)
GFR calc non Af Amer: 36 mL/min — ABNORMAL LOW (ref >60)
Glucose, Bld: 116 mg/dL — ABNORMAL HIGH (ref 65–99)
POTASSIUM: 3.3 mmol/L — AB (ref 3.5–5.1)
Sodium: 139 mmol/L (ref 135–145)

## 2016-08-27 LAB — GLUCOSE, CAPILLARY
GLUCOSE-CAPILLARY: 168 mg/dL — AB (ref 65–99)
Glucose-Capillary: 118 mg/dL — ABNORMAL HIGH (ref 65–99)

## 2016-08-27 MED ORDER — POTASSIUM CHLORIDE CRYS ER 20 MEQ PO TBCR
40.0000 meq | EXTENDED_RELEASE_TABLET | Freq: Two times a day (BID) | ORAL | Status: DC
  Administered 2016-08-27: 40 meq via ORAL
  Filled 2016-08-27: qty 2

## 2016-08-27 MED ORDER — FUROSEMIDE 20 MG PO TABS: 20.0000 mg | ORAL_TABLET | Freq: Every day | ORAL | 0 refills | Status: DC

## 2016-08-27 MED ORDER — POTASSIUM CHLORIDE CRYS ER 20 MEQ PO TBCR: 40.0000 meq | EXTENDED_RELEASE_TABLET | Freq: Every day | ORAL | 0 refills | Status: DC

## 2016-08-27 NOTE — Progress Notes (Signed)
Subjective: Kathleen Becker states she is feeling good today. She had no difficulty breathing overnight and feels that she has less swelling in her legs today. She has been watching her oxygen saturation- it has been in the high 90s-100s.   Objective:  Vital signs in last 24 hours: Vitals:   08/26/16 2101 08/27/16 0011 08/27/16 0803 08/27/16 0807  BP:  (!) 153/65 139/61   Pulse:  80 83   Resp:  (!) 23 (!) 21   Temp:  99.1 F (37.3 C) 98.6 F (37 C)   TempSrc:  Oral Oral   SpO2: 94% 94% 95% 99%  Weight:       Physical Exam  Constitutional: She appears well-developed and well-nourished. No distress.  HENT:  Salivary gland swelling  Dry mucous membranes   Eyes:  Right pupil injury and near blindness, left pupil constricted   Cardiovascular: Normal rate, regular rhythm and intact distal pulses.   No murmur heard. B/l lower extremity edema   Pulmonary/Chest: Effort normal. No respiratory distress. She has no wheezes. She has no rales.  Abdominal: Soft. She exhibits no distension. There is no tenderness.  Skin: She is not diaphoretic.   Assessment/Plan: Pt is a 70 y.o. yo female with a PMHx of diastolic CHF, COPD GOLD D, pulmonary hypertension, chronic hypoxic respiratory failure, non-immune hemolytic anemia who was admitted on 08/25/2016 with symptoms of shortness of breath, which was determined to be secondary to CHF exacerbation.   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 She tolerated oral lasix and K-dur well yesterday and had good  urine output. She had been on HCTZ 12.5 mg for years but this was discontinued 05/2016 when she was hospitalized for CHF exacerbation and diuresed with IV lasix. After that hospitalization she moved to [place] to be closer to family and did not have follow up to address whether or not the medication needed to be restarted.  -Continue po Lasix 20 mg daily  -continue K-Dur 40 meq qd  -continue daily weights, strict I& Os -continue amlodipine, atenolol, and atorvastatin   Chronic hypoxemic respiratory failure  Multifactorial related to COPD GOLD D, pulmonary HTN (left heart cath 06/17/2016 PA pressure 70) and anemia. She uses 2.5 L oxygen at home at bedtime and 4 L as needed for low pulse ox during the day at home. Initially she required BiPAP in the ED but currently she is sating in the high 90s on 1 L.   -Wean O2 to room air  -continue duonebs q6h  Hypokalemia  On presentation K 5.2. Today K 3.3, after multiple doses of lasix.  -Continue K-Dur 40 meq daily  -Follow up morning BMP   Hemolytic anemia  Hgb 10.4, improved from baseline 9. Stable and asymptomatic. Has had extensive outpatient workup and following with Dr. Beryle Beams outpatient. May consider a steroid trial at outpatient follow up.   Acute on CKD stage 3  Crt 1.4 today with baseline 1.1-1.3. Most likely related to decreased perfusion in the setting of CHF exacerbation. Will continue to monitor.   Type 2 DM HbA1c 5.3, well controlled with  oral medications outpatient. -continue sensitive insulin sliding scale  Dispo: Anticipated discharge in approximately today(s).   Ledell Noss, MD 08/27/2016, 9:45 AM Pager: [phone]

## 2016-08-27 NOTE — Care Management Note (Addendum)
Case Management Note  Patient Details  Name: Braylei Totino MRN: [id number] Date of Birth: [birth year]  Subjective/Objective:            Pt admitted for SOB        Action/Plan:  Pt is originally from [place] - PTA staying with daughter in [place] recuperating from previous hospital stay in [place].  Pt plans on returning to daughters home in [place] at discharge.  Pt confirmed she is completely independent and declined needing PT/OT eval - bedside nurse confirmed pt is independently ambulating.  CM informed pt that she needs to weigh daily and adhere to low sodium diet.  Pt is on home oxygen 2-4L with [facility]. - pt has portable oxygen tank for transport.  CM will continue to follow for discharge needs   Expected Discharge Date:                  Expected Discharge Plan:  Home/Self Care  In-House Referral:     Discharge planning Services  CM Consult  Post Acute Care Choice:    Choice offered to:     DME Arranged:    DME Agency:     HH Arranged:    HH Agency:     Status of Service:  In process, will continue to follow  If discussed at Long Length of Stay Meetings, dates discussed:    Additional Comments:  Maryclare Labrador, RN 08/27/2016, 10:49 AM

## 2016-08-27 NOTE — Discharge Instructions (Signed)
Thank you for trusting [place] with your medical care!  You were hospitalized for volume overload and treated with fluid pills.   Please take note of the following changes to your medications: START taking Lasix 20 mg daily  START taking potassium 40 meq daily   To make sure you are getting better, please make it to the follow-up appointments listed on the first page.  If you have any questions, please call [phone].

## 2016-08-27 NOTE — Discharge Summary (Signed)
Name: Kathleen Becker MRN: [id number] DOB: [birth year] 70 y.o. PCP: Burnard Bunting, MD  Date of Admission: 08/25/2016  8:16 AM Date of Discharge: 08/27/2016 Attending Physician: Annia Belt, MD  Discharge Diagnosis: 1. 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   amLODipine 10 MG tablet Commonly known as:  NORVASC Take 10 mg by mouth daily.   atenolol 50 MG tablet Commonly known as:  TENORMIN Take 50 mg by mouth daily.   atorvastatin 80 MG tablet Commonly known as:  LIPITOR Take 80 mg by mouth daily.   dorzolamide-timolol 22.3-6.8 MG/ML ophthalmic solution Commonly known as:  COSOPT Place 1 drop into both eyes 2 (two) times daily.   furosemide 20 MG tablet Commonly known as:  LASIX Take 1 tablet (20 mg total) by mouth daily. What changed:  when to take this  reasons to take this   glucose blood test strip 1 each by Other route as needed for other (blood glucose). Use as instructed   metFORMIN 500 MG tablet Commonly known as:  GLUCOPHAGE Take 500 mg by mouth 2 (two) times daily with a meal.   OVER THE COUNTER MEDICATION Take 1 capsule by mouth daily. "Maxivision" eye vitamin   pantoprazole 40 MG tablet Commonly known as:  PROTONIX Take 1 tablet (40 mg total) by mouth 2 (two) times daily.   potassium chloride SA 20 MEQ tablet Commonly known as:  K-DUR,KLOR-CON Take 2 tablets (40 mEq total) by mouth daily.   tiotropium 18 MCG inhalation capsule Commonly known  as:  SPIRIVA Place 1 capsule (18 mcg total) into inhaler and inhale daily.       Disposition and follow-up:   Kathleen Becker was discharged from [facility] in Good condition.  At the hospital follow up visit please address:  1.  Volume overload- Was she able to pick up lasix and potassium and has she been tolerating these?   2.  Labs / imaging needed at time of follow-up: BMP (check K)   3.  Pending labs/ test needing follow-up: none   Follow-up Appointments: Follow-up Information    [place]. Schedule an appointment as soon as possible for a visit in 2 weeks.   Why:  Pt. needs to contact facility. Facility is closed until after the [employer]. Contact information: [address] [place] [phone]          Hospital Course by problem list:    Acute exacerbation of CHF (congestive heart failure) ([place])   Acute on chronic right-sided heart failure 70 year old with history of diastolic dysfunction, pulmonary hypertension, chronic hypoxemic respiratory failure (wears 2.5 L oxygen at night and 4 L as needed during the day), COPD GOLD D, presented with worsening dyspnea. In the ED she was afebrile but in respiratory distress. She was placed on BiPAP was found to have BNP 1516. She was given 2 doses of IV lasix and weaned to nasal canula. Prior ECHO 06/11/2016  showed EF 36-43%, with diastolic dysfunction. She had been on HCTZ 12.5 mg for years but this was discontinued 05/2016 when she was hospitalized for CHF exacerbation and diuresed with IV lasix. After that hospitalization she moved to [place] to be closer to family and did not have follow up to address whether or not the medication needed to be restarted. [facility] for acute CHF exacerbation and she was started on home oxygen (2L qHS and 4L PRN low SpO2 during the day) and lasix 20 mg PRN and was monitoring her leg swelling for fluid  overload but did not realize that her difficulty breathing was another sign of fluid overload. At discharge we prescribed oral lasix 20 mg qd and K-Dur 40 meq daily.     Collagen vascular disease ([place]) Is being workup for collagen vascular disease because she was found to have higt ANA, in need of further workup as this may be related to her pulmonary hypertension.     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 Positive ANA may be contributing to her hemolytic anemia. Dr. Beryle Beams at the internal medicine center is following.     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 Initially required non rebreather in the ED but respiratory distress improved rapidly with diuresis and she was weaned to 2 L (her home oxygen level) on the day of discharge.   Discharge Vitals:   BP 139/61 (BP Location: Right Arm)   Pulse 81   Temp 98.6 F (37 C) (Oral)   Resp 18   Ht _0  (1.6 m)   Wt 166 lb 12.8 oz (75.7 kg)   SpO2 100%   BMI 29.55 kg/m   Pertinent Labs, Studies, and Procedures: Procedures Performed:  Dg Chest Portable 1 View  Result Date: 08/25/2016 CLINICAL DATA:  Short of breath, history of pulmonary hypertension EXAM: PORTABLE CHEST 1 VIEW COMPARISON:  Portable chest x-ray of 07/13/2016 FINDINGS: There is little change in poor aeration with probable bilateral effusions and basilar atelectasis. With cardiomegaly these findings most likely indicate CHF although pneumonia is difficult to exclude. No bony abnormality is seen. IMPRESSION: No significant change in poor aeration with effusions and cardiomegaly suggestive of CHF. Electronically Signed   By: Ivar Drape M.D.   On: 08/25/2016 08:48   Discharge Instructions: Discharge Instructions    Call MD for:  difficulty breathing, headache or visual disturbances     Complete by:  As directed    Call MD for:  severe uncontrolled pain    Complete by:  As directed    Call MD for:  temperature >100.4    Complete by:  As directed    Diet - low sodium heart healthy    Complete by:  As directed    Increase activity slowly    Complete by:  As directed       Signed: Ledell Noss, MD 08/29/2016, 6:05 PM   Pager: [phone]

## 2016-08-29 NOTE — Discharge Summary (Signed)
Medicine attending discharge note: I personally examined this patient on the day of discharge and I tested the accuracy of the discharge evaluation and plan as recorded in the final progress note by resident physician Dr. Ledell Noss.  Clinical summary: 70 year old woman with recently diagnosed hypoxic respiratory failure found secondary to advanced pulmonary hypertension. 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 She was recently started on home oxygen therapy. At time of recent hospital discharge she was advised to take diuretics on an as-needed basis. She really didn't understand what this meant and had not been taking any diuretics. She presented with recurrent dyspnea and leg swelling consistent with acute on chronic right heart failure.  Hospital course: She was treated with initial parenteral diuretics and then transitioned to oral diuretics. She was counseled on the use of a daily diuretic dose. Condition improved rapidly. Oxygen saturation remained stable and above 94% on supplemental oxygen. Peripheral edema significantly decreased. She will go home on 20 mg of Lasix daily and potassium 40 mEq daily. Continue oxygen and bronchodilators. Follow-up with pulmonary medicine for any additional recommendations. Her pulmonologist was updated on her current status. He will consider addition of medications available for idiopathic pulmonary hypertension in the future. Weight on December 18 at time of discharge 172 pounds. On admission December 27 166 pounds. I do not see a discharge weight recorded.  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 See my office note  for details. Although she has evidence of ongoing low-grade hemolysis with haptoglobin less than 10, she is compensated and her hemoglobin remained stable at or above 9.5 g with discharge value 10.4 g on December 27. She received an additional blood transfusion during recent hospital admission when she presented with a hemoglobin of 6.6. Hemoglobin has stabilized consistently above 9 g without need for further transfusion. Additional component of her anemia at that time felt to be related to aspirin induced gastritis with intermittent occult positive stools. Aspirin was stopped. She was started on a PPI. She had a recent follow-up visit with gastroenterology. Stools were guaiac negative. No further evaluation recommended at this time.  Disposition: Condition stable at time of discharge We would like to have her follow in her general medical clinic until she returns home and resumes care with her primary care physician in [facility]. She is currently staying with her daughter in [place]. I will continue to follow her for her hematologic issues. She will continue to follow with Dr. Lamonte Sakai for her pulmonary issues. There were no complications.

## 2016-09-16 ENCOUNTER — Ambulatory Visit: Payer: Medicare Other

## 2016-09-22 ENCOUNTER — Telehealth: Payer: Self-pay | Admitting: Internal Medicine

## 2016-09-22 NOTE — Telephone Encounter (Signed)
APT. REMINDER CALL, LMTCB °

## 2016-09-23 ENCOUNTER — Ambulatory Visit (INDEPENDENT_AMBULATORY_CARE_PROVIDER_SITE_OTHER): Payer: Medicare Other | Admitting: Internal Medicine

## 2016-09-23 ENCOUNTER — Encounter: Payer: Self-pay | Admitting: Internal Medicine

## 2016-09-23 VITALS — BP 164/66 | HR 67 | Temp 99.5°F | Wt 165.1 lb

## 2016-09-23 DIAGNOSIS — Z79899 Other long term (current) drug therapy: Secondary | ICD-10-CM | POA: Diagnosis not present

## 2016-09-23 DIAGNOSIS — I110 Hypertensive heart disease with heart failure: Secondary | ICD-10-CM

## 2016-09-23 DIAGNOSIS — Z87891 Personal history of nicotine dependence: Secondary | ICD-10-CM | POA: Diagnosis not present

## 2016-09-23 DIAGNOSIS — I5032 Chronic diastolic (congestive) heart failure: Secondary | ICD-10-CM | POA: Diagnosis not present

## 2016-09-23 DIAGNOSIS — I5030 Unspecified diastolic (congestive) heart failure: Secondary | ICD-10-CM

## 2016-09-23 DIAGNOSIS — I10 Essential (primary) hypertension: Secondary | ICD-10-CM

## 2016-09-23 MED ORDER — POTASSIUM CHLORIDE CRYS ER 20 MEQ PO TBCR: 40.0000 meq | EXTENDED_RELEASE_TABLET | Freq: Every day | ORAL | 0 refills | Status: DC

## 2016-09-23 MED ORDER — SPIRONOLACTONE 25 MG PO TABS: 25.0000 mg | ORAL_TABLET | Freq: Every day | ORAL | 1 refills | Status: DC

## 2016-09-23 MED ORDER — FUROSEMIDE 20 MG PO TABS: 20.0000 mg | ORAL_TABLET | Freq: Every day | ORAL | 0 refills | Status: DC

## 2016-09-23 NOTE — Patient Instructions (Signed)
Ms. Dedominicis,  It was a pleasure meeting you today.  I am going to refill your lasix and potassium today. I am checking some labs and see how your potassium is doing.   Your blood pressure is elevated today. I would like to get you started on another blood pressure medication called spironolactone.  Please follow up in clinic in 2-4 weeks.

## 2016-09-23 NOTE — Assessment & Plan Note (Signed)
BP Readings from Last 3 Encounters:  09/23/16 (!) 164/66  08/27/16 139/61  08/16/16 (!) 166/72    Lab Results  Component Value Date   NA 139 08/27/2016   K 3.3 (L) 08/27/2016   CREATININE 1.43 (H) 08/27/2016   Currently on atenolol 50 mg daily and amlodipine 10 mg daily. SBP elevated today in the 160s and remained elevated on re-check. Patient reports it runs 120-130s at home. Discussed additional antihypertensives but patient reported she did not believe she needed them and declined starting anything new today.    Assessment: Worsening HTN  Plan: Continue current medications today Has a history of swelling with lisinopril, would avoid ACE/ARB in the future. Currently on loop diuretic and would avoid HCTZ. Consider addition of spironolactone if BP remains elevated.  Asked to return in 2-4 weeks for re-check and bring BP log with her.

## 2016-09-23 NOTE — Assessment & Plan Note (Signed)
  Patient was admitted from 12/27 - 12/29 with acute on chronic dyspnea with respiratory distress. Noted to be volume overloaded at that time with elevated BNP of 1516. She was diuresed with weight 172 > 166 lbs at discharge. Weight today is 165 lbs.   Denies any shortness of breath, lower extremity swelling. Reports compliance with the lasix 20 mg daily and KDur 20 mEq daily. Weighing herself daily at home. Says it runs 160-162 lbs.   No signs of volume overload on exam today.   Assessment: HFpEF  Plan: Will continue current medications today Check BMET

## 2016-09-23 NOTE — Progress Notes (Signed)
   CC: Hospital follow up for CHF exacerbation  HPI:  Kathleen Becker is a 71 y.o. female with a past medical history listed below here today for follow up after recent hospitalization for acute exacerbation of her chronic right sided heart failure.   Plans to return home in the spring and follow up with PCP there.   For details of today's visit please refer to the assessment and plan.  Past Medical History:  Diagnosis Date  . Diabetes mellitus without complication ([place])   . 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 High cholesterol   . Hypertension     Review of Systems:  See HPI  Physical Exam:  Vitals:   09/23/16 1332  BP: (!) 163/67  Pulse: 76  Temp: 99.5 F (37.5 C)  TempSrc: Oral  SpO2: 93%  Weight: 165 lb 1.6 oz (74.9 kg)   Physical Exam  Constitutional: She is well-developed, well-nourished, and in no distress. No distress.  HENT:  Head: Normocephalic and atraumatic.  Neck: No JVD present.  Cardiovascular: Normal rate and regular rhythm.  Exam reveals no gallop and no friction rub.   No murmur heard. Pulmonary/Chest: Effort normal and breath sounds normal. She has no wheezes. She has no rales.  Musculoskeletal: She exhibits no edema.  Skin: Skin is warm and dry.   Assessment & Plan:   See Encounters Tab for problem based charting.  Patient discussed with Dr. Dareen Piano

## 2016-09-24 LAB — BMP8+ANION GAP
ANION GAP: 18 mmol/L (ref 10.0–18.0)
BUN/Creatinine Ratio: 11 — ABNORMAL LOW (ref 12–28)
BUN: 14 mg/dL (ref 8–27)
CHLORIDE: 101 mmol/L (ref 96–106)
CO2: 24 mmol/L (ref 18–29)
Calcium: 9.6 mg/dL (ref 8.7–10.3)
Creatinine, Ser: 1.28 mg/dL — ABNORMAL HIGH (ref 0.57–1.00)
GFR, EST AFRICAN AMERICAN: 49 mL/min/{1.73_m2} — AB (ref >59)
GFR, EST NON AFRICAN AMERICAN: 42 mL/min/{1.73_m2} — AB (ref >59)
Glucose: 74 mg/dL (ref 65–99)
Potassium: 4 mmol/L (ref 3.5–5.2)
Sodium: 143 mmol/L (ref 134–144)

## 2016-09-24 NOTE — Progress Notes (Signed)
Internal Medicine Clinic Attending  Case discussed with Dr. Charlynn Grimes at the time of the visit.  We reviewed the resident's history and exam and pertinent patient test results.  I agree with the assessment, diagnosis, and plan of care documented in the resident's note.

## 2016-09-27 ENCOUNTER — Telehealth: Payer: Self-pay | Admitting: Emergency Medicine

## 2016-09-27 NOTE — Telephone Encounter (Signed)
Referral  Referral # [id number]  Referral Information   Referral # Creation Date Referral Status Status Update  [id number] 08/13/2016 Authorized 08/16/2016: Status History  Status Reason Referral Type Referral Reasons Referral Class  No Approval Necessary - Patient Tracking Consultation Specialty Services Required Outgoing  To Specialty To Provider To Location/POS To Department  Rheumatology [facility] Rhematology none none  To Vendor Referred By By Location/POS By Department  none none [place] [facility]  Priority Start Date Expiration Date Referral Entered By  Routine 08/13/2016 05/10/2017 Valerie Salts, [place]  Visits Requested Visits Authorized Visits Completed Visits Scheduled  1 1 0 0  Procedure Information   Procedure Modifiers Provider Requested Approved  [phone] - Ambulatory referral to Rheumatology   1 1  Diagnosis Information   Diagnosis  J96.11 (ICD-10-CM) - Chronic hypoxemic respiratory failure [facility])  Referral Notes  Number of Notes: 3  Type Date User Summary Attachment  General 09/07/2016 9:24 AM Ilona Sorrel - -  Note   Fax received from [facility] at [place] stating they have made 3 attempts to reach the pt with no returned call.        Type Date User Summary Attachment  General 08/16/2016 12:02 PM Ilona Sorrel - -  Note   Confirmation received from fax sent to [facility].        Type Date User Summary Attachment  General 08/16/2016 10:25 AM Ilona Sorrel - -  Note   Referral faxed to [facility] Rheumatology with RB's preliminary no         Spoke with pt, she states she didn't have a referral at [place] [facility]. I called over there and advised them to call pt to schedule. Ivin Booty from their office will have Cecille Rubin call pt again. I gave them both number. Nothing further needed.

## 2016-10-01 ENCOUNTER — Ambulatory Visit (HOSPITAL_BASED_OUTPATIENT_CLINIC_OR_DEPARTMENT_OTHER): Payer: Medicare Other | Attending: Emergency Medicine | Admitting: Pulmonary Disease

## 2016-10-01 VITALS — Ht 63.0 in | Wt 160.0 lb

## 2016-10-01 DIAGNOSIS — J9611 Chronic respiratory failure with hypoxia: Secondary | ICD-10-CM | POA: Diagnosis not present

## 2016-10-01 DIAGNOSIS — J449 Chronic obstructive pulmonary disease, unspecified: Secondary | ICD-10-CM | POA: Insufficient documentation

## 2016-10-01 DIAGNOSIS — G4733 Obstructive sleep apnea (adult) (pediatric): Secondary | ICD-10-CM

## 2016-10-05 DIAGNOSIS — G4733 Obstructive sleep apnea (adult) (pediatric): Secondary | ICD-10-CM | POA: Insufficient documentation

## 2016-10-05 DIAGNOSIS — J449 Chronic obstructive pulmonary disease, unspecified: Secondary | ICD-10-CM

## 2016-10-05 NOTE — Procedures (Signed)
   Patient Name: Kathleen Becker, Kathleen Becker Date: 10/01/2016 Gender: Female D.O.B: Dec 29, 1945 Age (years): 72 Referring Provider: Baltazar Apo Height (inches): 68 Interpreting Physician: Chesley Mires MD, ABSM Weight (lbs): 160 RPSGT: Baxter Flattery BMI: 28 MRN: [id number] Neck Size: 14.50  CLINICAL INFORMATION Sleep Study Type: NPSG  Indication for sleep study: Excessive Daytime Sleepiness, Fatigue, Snoring, Witnessed Apneas.  She has history of severe COPD and pulmonary hypertension.  Epworth Sleepiness Score: 3  SLEEP STUDY TECHNIQUE As per the AASM Manual for the Scoring of Sleep and Associated Events v2.3 (April 2016) with a hypopnea requiring 4% desaturations.  The channels recorded and monitored were frontal, central and occipital EEG, electrooculogram (EOG), submentalis EMG (chin), nasal and oral airflow, thoracic and abdominal wall motion, anterior tibialis EMG, snore microphone, electrocardiogram, and pulse oximetry.  MEDICATIONS Medications self-administered by patient taken the night of the study : N/A  SLEEP ARCHITECTURE The study was initiated at 10:52:43 PM and ended at 5:14:42 AM.  Sleep onset time was 23.4 minutes and the sleep efficiency was 58.7%. The total sleep time was 224.1 minutes.  Stage REM latency was 217.5 minutes.  The patient spent 12.27% of the night in stage N1 sleep, 69.61% in stage N2 sleep, 0.00% in stage N3 and 18.11% in REM.  Alpha intrusion was absent.  Supine sleep was 3.35%.  RESPIRATORY PARAMETERS The overall apnea/hypopnea index (AHI) was 11.0 per hour. There were 39 total apneas, including 39 obstructive, 0 central and 0 mixed apneas. There were 2 hypopneas and 19 RERAs.  The AHI during Stage REM sleep was 26.6 per hour.  AHI while supine was 0.0 per hour.  The mean oxygen saturation was 94.96%. The minimum SpO2 during sleep was 82.00%.  Loud snoring was noted during this study.  CARDIAC DATA The 2 lead EKG demonstrated sinus  rhythm. The mean heart rate was 24.32 beats per minute. Other EKG findings include: None.  LEG MOVEMENT DATA The total PLMS were 0 with a resulting PLMS index of 0.00. Associated arousal with leg movement index was 0.0 .  IMPRESSIONS - This study shod mild obstructive sleep apnea with an AHI of 11 and SaO2 low of 82%. - The study was conducted with her using 2 liters supplemental oxygen.  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  Will need to determine if she needs CPAP versus Bipap +/- supplemental oxygen while asleep.  [Electronically signed] 10/05/2016 08:48 AM  Chesley Mires MD, ABSM Diplomate, American Board of Sleep Medicine   NPI: [id number]

## 2016-10-21 ENCOUNTER — Other Ambulatory Visit: Payer: Self-pay | Admitting: *Deleted

## 2016-10-21 MED ORDER — FUROSEMIDE 20 MG PO TABS: 20.0000 mg | ORAL_TABLET | Freq: Every day | ORAL | 4 refills | Status: AC

## 2016-10-21 MED ORDER — POTASSIUM CHLORIDE CRYS ER 20 MEQ PO TBCR: 40.0000 meq | EXTENDED_RELEASE_TABLET | Freq: Every day | ORAL | 4 refills | Status: DC

## 2016-10-26 ENCOUNTER — Telehealth: Payer: Self-pay | Admitting: Internal Medicine

## 2016-10-26 NOTE — Telephone Encounter (Signed)
APT. REMINDER CALL, LMTCB °

## 2016-10-27 ENCOUNTER — Ambulatory Visit (INDEPENDENT_AMBULATORY_CARE_PROVIDER_SITE_OTHER): Payer: Medicare Other | Admitting: Pulmonary Disease

## 2016-10-27 ENCOUNTER — Encounter: Payer: Self-pay | Admitting: Pulmonary Disease

## 2016-10-27 VITALS — BP 141/57 | HR 63 | Temp 98.2°F | Ht 63.0 in | Wt 156.1 lb

## 2016-10-27 DIAGNOSIS — J449 Chronic obstructive pulmonary disease, unspecified: Secondary | ICD-10-CM

## 2016-10-27 DIAGNOSIS — Z79899 Other long term (current) drug therapy: Secondary | ICD-10-CM

## 2016-10-27 DIAGNOSIS — G4733 Obstructive sleep apnea (adult) (pediatric): Secondary | ICD-10-CM

## 2016-10-27 DIAGNOSIS — N183 Chronic kidney disease, stage 3 unspecified: Secondary | ICD-10-CM

## 2016-10-27 DIAGNOSIS — I129 Hypertensive chronic kidney disease with stage 1 through stage 4 chronic kidney disease, or unspecified chronic kidney disease: Secondary | ICD-10-CM

## 2016-10-27 DIAGNOSIS — Z87891 Personal history of nicotine dependence: Secondary | ICD-10-CM | POA: Diagnosis not present

## 2016-10-27 DIAGNOSIS — E1122 Type 2 diabetes mellitus with diabetic chronic kidney disease: Secondary | ICD-10-CM | POA: Diagnosis not present

## 2016-10-27 DIAGNOSIS — E1121 Type 2 diabetes mellitus with diabetic nephropathy: Secondary | ICD-10-CM

## 2016-10-27 DIAGNOSIS — I10 Essential (primary) hypertension: Secondary | ICD-10-CM | POA: Diagnosis not present

## 2016-10-27 NOTE — Assessment & Plan Note (Signed)
Her A1c was 5.3 in December. Her meter readings are in the 110s fasting. Consider decreasing or discontinuing metformin - I asked her to discuss this with her primary care provider

## 2016-10-27 NOTE — Assessment & Plan Note (Addendum)
Assessment: Her BP at home has been 118/69-132/71. Here her BP was 141/57.   Plan:  Continue amlodipine 41m daily Continue atenolol 541mdaily Continue Lasix 20106maily Continue spironolactone 3m34mily Recheck BMP today

## 2016-10-27 NOTE — Assessment & Plan Note (Signed)
Briefly discussed with her sleep study results and recommendations. She has an appointment with her pulmonologist soon.

## 2016-10-27 NOTE — Patient Instructions (Addendum)
Please follow up in 6-12 weeks, or sooner as needed

## 2016-10-27 NOTE — Progress Notes (Signed)
   CC: hypertension follow up  HPI:  Kathleen Becker is a 71 y.o. woman with history as noted below here for follow up of hypertension  She was seen in clinic on 1/25. Cr down to 1.28 from 1.43. Notes say that she was not started on any medications but AVS says she was started on spironolactone 57m daily. She has started the spironolactone. Her BP at home has been 118/69-132/71  She had a sleep study on 2/2. She has mild OSA with AHI 11 and SaO2 low of 82%. Recommended to return to sleep lab for full night titration study. She has an appointment with her pulmonologist coming up.   Past Medical History:  Diagnosis Date  . [place]   . 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 High cholesterol   . Hypertension     Review of Systems:   No chest pain No dyspnea  Physical Exam:  Vitals:   10/27/16 1327 10/27/16 1355  BP: (!) 147/58 (!) 141/57  Pulse: 67 63  Temp: 98.2 F (36.8 C)   TempSrc: Oral   SpO2: 98%   Weight: 156 lb 1.6 oz (70.8 kg)   Height: _0  (1.6 m)    General Apperance: NAD HEENT: Normocephalic, atraumatic, anicteric sclera Neck: Supple, trachea midline Lungs: Clear to auscultation bilaterally. No wheezes, rhonchi or rales. Breathing comfortably Heart: Regular rate and rhythm, no murmur/rub/gallop Abdomen: Soft, nontender, nondistended, no rebound/guarding Extremities: Warm and well perfused, no edema Skin: No rashes or lesions Neurologic: Alert and interactive. No gross deficits.  Assessment & Plan:   See Encounters Tab for problem based charting.  Patient discussed with Dr. KEppie Gibson

## 2016-10-28 ENCOUNTER — Telehealth: Payer: Self-pay

## 2016-10-28 DIAGNOSIS — N183 Chronic kidney disease, stage 3 unspecified: Secondary | ICD-10-CM | POA: Insufficient documentation

## 2016-10-28 LAB — BMP8+ANION GAP
Anion Gap: 17 mmol/L (ref 10.0–18.0)
BUN/Creatinine Ratio: 19 (ref 12–28)
BUN: 30 mg/dL — ABNORMAL HIGH (ref 8–27)
CALCIUM: 10.1 mg/dL (ref 8.7–10.3)
CHLORIDE: 100 mmol/L (ref 96–106)
CO2: 22 mmol/L (ref 18–29)
Creatinine, Ser: 1.57 mg/dL — ABNORMAL HIGH (ref 0.57–1.00)
GFR calc Af Amer: 38 mL/min/{1.73_m2} — ABNORMAL LOW (ref >59)
GFR calc non Af Amer: 33 mL/min/{1.73_m2} — ABNORMAL LOW (ref >59)
GLUCOSE: 85 mg/dL (ref 65–99)
POTASSIUM: 4.9 mmol/L (ref 3.5–5.2)
SODIUM: 139 mmol/L (ref 134–144)

## 2016-10-28 NOTE — Telephone Encounter (Signed)
Requesting lab result. Please call back.

## 2016-10-28 NOTE — Telephone Encounter (Signed)
Potassium was normal. Her creatinine did go up to 1.57 from 1.28 but could just be normal fluctuation within her chronic kidney disease. Will continue to follow her renal function. Thanks!

## 2016-10-28 NOTE — Progress Notes (Signed)
Case discussed with Dr. Randell Patient at the time of the visit. We reviewed the resident's history and exam and pertinent patient test results. I agree with the assessment, diagnosis, and plan of care documented in the resident's note.

## 2016-10-28 NOTE — Assessment & Plan Note (Signed)
Potassium was normal. Her creatinine did go up to 1.57 from 1.28 but could just be normal fluctuation within her chronic kidney disease. Would have it rechecked at follow up to make sure it stabilizes.

## 2016-10-29 NOTE — Telephone Encounter (Signed)
Called pt, gave her dr Marjory Sneddon message, she will call back to schedule next appt

## 2016-11-05 ENCOUNTER — Ambulatory Visit (INDEPENDENT_AMBULATORY_CARE_PROVIDER_SITE_OTHER): Payer: Medicare Other | Admitting: Emergency Medicine

## 2016-11-05 ENCOUNTER — Encounter: Payer: Self-pay | Admitting: Emergency Medicine

## 2016-11-05 DIAGNOSIS — J449 Chronic obstructive pulmonary disease, unspecified: Secondary | ICD-10-CM | POA: Diagnosis not present

## 2016-11-05 DIAGNOSIS — G4733 Obstructive sleep apnea (adult) (pediatric): Secondary | ICD-10-CM

## 2016-11-05 DIAGNOSIS — I2723 Pulmonary hypertension due to lung diseases and hypoxia: Secondary | ICD-10-CM | POA: Diagnosis not present

## 2016-11-05 DIAGNOSIS — I5030 Unspecified diastolic (congestive) heart failure: Secondary | ICD-10-CM

## 2016-11-05 NOTE — Assessment & Plan Note (Signed)
Continue Spiriva, albuterol prn

## 2016-11-05 NOTE — Assessment & Plan Note (Signed)
Needs good blood pressure control and optimal volume status. Doing well at this time.

## 2016-11-05 NOTE — Assessment & Plan Note (Signed)
Arrange for a CPAP / BiPAp titration. Suspect she will need o2 bled into her system.

## 2016-11-05 NOTE — Progress Notes (Signed)
Subjective:    Patient ID: Kathleen Becker, female    DOB: May 05, 1946, 71 y.o.   MRN: [id number]  HPI 71 year old female with PMH of HTN And diastolic CHF, HLD, Type 2 Diabetes, CHF, former smoker, severe pulmonary HTN. I met her when she was admitted to the hospital in mid November 2017. She has an echocardiogram from 06/08/2016 that showed a vigorous EF, diastolic dysfunction, LVH, normal size and function. She had newly diagnosed connective tissue disease during the hospitalization with an ANA in a centromere pattern. She was discharged on oxygen 4L/min, spiriva. She is no longer smoking. She also had anemia during that hospitalization - has followed with Dr Cristina Gong.   She underwent PFT 07/14/16 that I personally reviewed. These identified very severe obstruction with an FEV1 of 0.52 L (30%). No volumes done, probable superimposed restriction. No sleep study.   ROV 11/05/16 -- This follow-up visit for severe obstructive lung disease and multifactorial severe pulmonary hypertension. She has a history of connective tissue disease. She underwent a sleep study on 10/01/16 that showed mild obstructive sleep apnea with associated desaturations on 2 L/m. She has OSA COPD overlap syndrome. It was recommended that she return for CPAP or BiPAP titration study. At her last visit we continued Spiriva and albuterol when necessary. She does not feel dyspneic even off O2.     Review of Systems  Constitutional: Negative for fever and unexpected weight change.  HENT: Negative for congestion, dental problem, ear pain, nosebleeds, postnasal drip, rhinorrhea, sinus pressure, sneezing, sore throat and trouble swallowing.   Eyes: Negative for redness and itching.  Respiratory: Negative for chest tightness and wheezing.   Cardiovascular: Negative for palpitations and leg swelling.  Gastrointestinal: Negative for nausea and vomiting.  Genitourinary: Negative for dysuria.  Musculoskeletal: Negative for joint swelling.    Skin: Negative for rash.  Neurological: Negative for headaches.  Hematological: Does not bruise/bleed easily.  Psychiatric/Behavioral: Negative for dysphoric mood. The patient is not nervous/anxious.    As per HPI  Past Medical History:  Diagnosis Date  . Diabetes mellitus without complication ([place])   . 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 High cholesterol   . Hypertension      No family history on file.   Social History   Social History  . Marital status: Divorced    Spouse name: N/A  . Number of children: N/A  . Years of education: N/A   Occupational History  . Not on file.   Social History Main Topics  . Smoking status: Former Smoker    Packs/day: 0.25    Years: 20.00    Types: Cigarettes    Quit date: 08/31/1995  . Smokeless tobacco: Never Used  . Alcohol use No  . Drug use: No  . Sexual activity: Not on file   Other Topics Concern  . Not on file   Social History Narrative  . No narrative on file     Allergies  Allergen Reactions  . Ace Inhibitors Swelling  . Lisinopril Swelling     Outpatient Medications Prior to Visit  Medication Sig Dispense Refill  . albuterol (PROVENTIL HFA;VENTOLIN HFA) 108 (90 Base) MCG/ACT inhaler Inhale 1-2 puffs into the lungs every 4 (four) hours as needed for wheezing or shortness of breath. 1 Inhaler 1  . amLODipine (NORVASC) 10 MG tablet Take 10 mg by mouth daily.    Marland Kitchen atenolol (TENORMIN) 50 MG tablet Take 50 mg by mouth daily.    Marland Kitchen atorvastatin (LIPITOR)  80 MG tablet Take 80 mg by mouth daily.    . dorzolamide-timolol (COSOPT) 22.3-6.8 MG/ML ophthalmic solution Place 1 drop into both eyes 2 (two) times daily.    . furosemide (LASIX) 20 MG tablet Take 1 tablet (20 mg total) by mouth daily. 30 tablet 4  . glucose blood test strip 1 each by Other route as needed for other (blood glucose). Use as instructed    . metFORMIN (GLUCOPHAGE) 500 MG tablet Take 500 mg by mouth 2 (two) times daily with a meal.    . OVER THE  COUNTER MEDICATION Take 1 capsule by mouth daily. "Maxivision" eye vitamin    . pantoprazole (PROTONIX) 40 MG tablet Take 1 tablet (40 mg total) by mouth 2 (two) times daily. 60 tablet 5  . potassium chloride SA (K-DUR,KLOR-CON) 20 MEQ tablet Take 2 tablets (40 mEq total) by mouth daily. 30 tablet 4  . spironolactone (ALDACTONE) 25 MG tablet Take 25 mg by mouth daily.    Marland Kitchen tiotropium (SPIRIVA) 18 MCG inhalation capsule Place 1 capsule (18 mcg total) into inhaler and inhale daily. 30 capsule 12   No facility-administered medications prior to visit.          Objective:   Physical Exam Vitals:   11/05/16 1034  BP: 118/60  Pulse: 71  SpO2: 100%  Weight: 157 lb 12.8 oz (71.6 kg)  Height: _0  (1.6 m)   Gen: Pleasant, well-nourished, in no distress,  normal affect  ENT: No lesions,  mouth clear,  oropharynx clear, no postnasal drip  Neck: No JVD, no TMG, no carotid bruits  Lungs: No use of accessory muscles,clear B   Cardiovascular: RRR, heart sounds normal, no murmur or gallops, no peripheral edema  Musculoskeletal: No deformities, no cyanosis or clubbing  Neuro: alert, non focal  Skin: Warm, no lesions or rashes     Assessment & Plan:  Pulmonary hypertension due to chronic obstructive pulmonary disease (HCC) Secondary pulmonary hypertension in the setting of significant obstructive lung disease, 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 She has been seen by rheumatology and no indication for treatment at this time. Also probably mild obstructive sleep apnea. It has been recommended that she undergo CPAP/BiPAP titration as below. Walking  oximetry today. I underscored with her the importance of good compliance with her oxygen  (HFpEF) heart failure with preserved ejection fraction (HCC) Needs good blood pressure control and optimal volume status. Doing well at this time.   GOLD D very severe COPD by GOLD classification ([place]) Continue Spiriva, albuterol prn  OSA and  COPD overlap syndrome ([place]) Arrange for a CPAP / BiPAp titration. Suspect she will need o2 bled into her system.   Baltazar Apo, MD, PhD 11/05/2016, 11:00 AM [facility] [phone] or if no answer [phone]

## 2016-11-05 NOTE — Addendum Note (Signed)
Addended by: Maryanna Shape A on: 11/05/2016 11:49 AM   Modules accepted: Orders

## 2016-11-05 NOTE — Patient Instructions (Addendum)
Please continue your Spiriva daily Take albuterol 2 puffs up to every 4 hours if needed for shortness of breath.  We will send you to the sleep lab for a CPAP / BiPAP titration study with oxygen We will plan to recheck your echocardiogram in the future after we have treated your low oxygen levels (day and night).  Follow with Dr Lamonte Sakai in 3 months or sooner if you have any problems.

## 2016-11-05 NOTE — Assessment & Plan Note (Signed)
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 She has been seen by rheumatology and no indication for treatment at this time. Also probably mild obstructive sleep apnea. It has been recommended that she undergo CPAP/BiPAP titration as below. Walking  oximetry today. I underscored with her the importance of good compliance with her oxygen

## 2016-11-18 ENCOUNTER — Other Ambulatory Visit: Payer: Self-pay | Admitting: Internal Medicine

## 2016-12-06 ENCOUNTER — Encounter (HOSPITAL_BASED_OUTPATIENT_CLINIC_OR_DEPARTMENT_OTHER): Payer: Medicare Other

## 2016-12-20 ENCOUNTER — Other Ambulatory Visit: Payer: Self-pay | Admitting: Emergency Medicine

## 2016-12-22 ENCOUNTER — Other Ambulatory Visit: Payer: Self-pay | Admitting: *Deleted

## 2016-12-23 MED ORDER — POTASSIUM CHLORIDE CRYS ER 20 MEQ PO TBCR: 40.0000 meq | EXTENDED_RELEASE_TABLET | Freq: Every day | ORAL | 3 refills | Status: DC

## 2016-12-24 ENCOUNTER — Ambulatory Visit (HOSPITAL_BASED_OUTPATIENT_CLINIC_OR_DEPARTMENT_OTHER): Payer: Medicare Other | Attending: Emergency Medicine | Admitting: Pulmonary Disease

## 2016-12-24 DIAGNOSIS — J449 Chronic obstructive pulmonary disease, unspecified: Secondary | ICD-10-CM

## 2016-12-24 DIAGNOSIS — G4733 Obstructive sleep apnea (adult) (pediatric): Secondary | ICD-10-CM | POA: Insufficient documentation

## 2016-12-27 ENCOUNTER — Other Ambulatory Visit: Payer: Self-pay | Admitting: Gastroenterology

## 2016-12-27 DIAGNOSIS — R195 Other fecal abnormalities: Secondary | ICD-10-CM

## 2016-12-27 DIAGNOSIS — J449 Chronic obstructive pulmonary disease, unspecified: Secondary | ICD-10-CM | POA: Diagnosis not present

## 2016-12-27 DIAGNOSIS — G4733 Obstructive sleep apnea (adult) (pediatric): Secondary | ICD-10-CM

## 2016-12-27 NOTE — Procedures (Signed)
    Patient Name: Kathleen Becker, [place] Date: 12/24/2016   Gender: Female  D.O.B: Dec 01, 1945  Age (years): 60  Referring Provider: Baltazar Apo   Height (inches): 63  Interpreting Physician: Chesley Mires MD, ABSM  Weight (lbs): 160  RPSGT: Earney Hamburg   BMI: 28  MRN: [id number]  Neck Size: 14.50    CLINICAL INFORMATION  The patient is referred for a CPAP titration to treat sleep apnea. She has history of COPD, HFpEF, and pulmonary hypertension. Date of NPSG, Split Night or HST: 10/01/16, AHI 11. SLEEP STUDY TECHNIQUE  As per the AASM Manual for the Scoring of Sleep and Associated Events v2.3 (April 2016) with a hypopnea requiring 4% desaturations.  The channels recorded and monitored were frontal, central and occipital EEG, electrooculogram (EOG), submentalis EMG (chin), nasal and oral airflow, thoracic and abdominal wall motion, anterior tibialis EMG, snore microphone, electrocardiogram, and pulse oximetry. Continuous positive airway pressure (CPAP) was initiated at the beginning of the study and titrated to treat sleep-disordered breathing. MEDICATIONS  Medications self-administered by patient taken the night of the study : N/A  TECHNICIAN COMMENTS  Comments added by technician: Patient had difficulty initiating sleep.  Comments added by scorer: N/A  RESPIRATORY PARAMETERS  Optimal PAP Pressure (cm): 9 AHI at Optimal Pressure (/hr): 0.0  Overall Minimal O2 (%): 90.00 Supine % at Optimal Pressure (%): 0  Minimal O2 at Optimal Pressure (%): 92.0     She did not require supplemental oxygen once she achieved adequate control of sleep apnea with CPAP.  SLEEP ARCHITECTURE  The study was initiated at 10:36:54 PM and ended at 5:08:50 AM.  Sleep onset time was 75.3 minutes and the sleep efficiency was 39.6%. The total sleep time was 155.1 minutes.  The patient spent 6.13% of the night in stage N1 sleep, 85.17% in stage N2 sleep, 0.00% in stage N3 and 8.70% in REM.Stage REM  latency was 301.0 minutes  Wake after sleep onset was 161.5. Alpha intrusion was absent. Supine sleep was 0.00%. CARDIAC DATA  The 2 lead EKG demonstrated sinus rhythm. The mean heart rate was 67.79 beats per minute. Other EKG findings include: None.  LEG MOVEMENT DATA  The total Periodic Limb Movements of Sleep (PLMS) were 2. The PLMS index was 0.77. A PLMS index of <15 is considered normal in adults. IMPRESSIONS  - The optimal PAP pressure was 9 cm of water. DIAGNOSIS  - Obstructive Sleep Apnea (327.23 [G47.33 ICD-10]) RECOMMENDATIONS  - Trial of CPAP therapy on 9 cm H2O.  - She was fitted with a Medium size [employer] Full Face Mask Simplus mask and heated humidification. [Electronically signed] 12/27/2016 11:42 PM Chesley Mires MD, [place], American Board of Sleep Medicine  NPI: [id number]

## 2017-01-13 ENCOUNTER — Ambulatory Visit
Admission: RE | Admit: 2017-01-13 | Discharge: 2017-01-13 | Disposition: A | Discharge status: Home / Self Care | Payer: Medicare Other | Source: Ambulatory Visit | Attending: Gastroenterology | Admitting: Gastroenterology

## 2017-01-13 DIAGNOSIS — R195 Other fecal abnormalities: Secondary | ICD-10-CM

## 2017-02-24 ENCOUNTER — Encounter: Payer: Self-pay | Admitting: *Deleted

## 2017-02-24 ENCOUNTER — Ambulatory Visit (INDEPENDENT_AMBULATORY_CARE_PROVIDER_SITE_OTHER): Payer: Medicare Other | Admitting: Emergency Medicine

## 2017-02-24 ENCOUNTER — Encounter: Payer: Self-pay | Admitting: Emergency Medicine

## 2017-02-24 DIAGNOSIS — G4733 Obstructive sleep apnea (adult) (pediatric): Secondary | ICD-10-CM | POA: Diagnosis not present

## 2017-02-24 DIAGNOSIS — J449 Chronic obstructive pulmonary disease, unspecified: Secondary | ICD-10-CM

## 2017-02-24 DIAGNOSIS — I2723 Pulmonary hypertension due to lung diseases and hypoxia: Secondary | ICD-10-CM

## 2017-02-24 DIAGNOSIS — J9611 Chronic respiratory failure with hypoxia: Secondary | ICD-10-CM

## 2017-02-24 NOTE — Progress Notes (Signed)
Subjective:    Patient ID: Kathleen Becker, female    DOB: 1946/06/22, 71 y.o.   MRN: [id number]  HPI 71 year old female with PMH of HTN And diastolic CHF, HLD, Type 2 Diabetes, CHF, former smoker, severe pulmonary HTN. I met her when she was admitted to the hospital in mid November 2017. She has an echocardiogram from 06/08/2016 that showed a vigorous EF, diastolic dysfunction, LVH, normal size and function. She had newly diagnosed connective tissue disease during the hospitalization with an ANA in a centromere pattern. She was discharged on oxygen 4L/min, spiriva. She is no longer smoking. She also had anemia during that hospitalization - has followed with Dr Cristina Gong.   She underwent PFT 07/14/16 that I personally reviewed. These identified very severe obstruction with an FEV1 of 0.52 L (30%). No volumes done, probable superimposed restriction. No sleep study.   ROV 11/05/16 -- This follow-up visit for severe obstructive lung disease and multifactorial severe pulmonary hypertension. She has a history of connective tissue disease. She underwent a sleep study on 10/01/16 that showed mild obstructive sleep apnea with associated desaturations on 2 L/m. She has OSA COPD overlap syndrome. It was recommended that she return for CPAP or BiPAP titration study. At her last visit we continued Spiriva and albuterol when necessary. She does not feel dyspneic even off O2.   ROV 02/24/17 -- Is a history of severe obstructive lung disease, OSA, connective tissue disease with a positive ANA multifactorial severe pulmonary arterial hypertension. Since last visit we have performed a CPAP/BiPAP titration study. This recommended initiation of CPAP at 9 cm H2O. She is on Spiriva, pro-air 1-2x a day. She is not on immunomodulatory drugs right now. She feels better, less SOB, no edema (on lasix).     Review of Systems  Constitutional: Negative for fever and unexpected weight change.  HENT: Negative for congestion, dental  problem, ear pain, nosebleeds, postnasal drip, rhinorrhea, sinus pressure, sneezing, sore throat and trouble swallowing.   Eyes: Negative for redness and itching.  Respiratory: Negative for chest tightness and wheezing.   Cardiovascular: Negative for palpitations and leg swelling.  Gastrointestinal: Negative for nausea and vomiting.  Genitourinary: Negative for dysuria.  Musculoskeletal: Negative for joint swelling.  Skin: Negative for rash.  Neurological: Negative for headaches.  Hematological: Does not bruise/bleed easily.  Psychiatric/Behavioral: Negative for dysphoric mood. The patient is not nervous/anxious.    As per HPI  Past Medical History:  Diagnosis Date  . Diabetes mellitus without complication ([place])   . 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 High cholesterol   . Hypertension      No family history on file.   Social History   Social History  . Marital status: Divorced    Spouse name: N/A  . Number of children: N/A  . Years of education: N/A   Occupational History  . Not on file.   Social History Main Topics  . Smoking status: Former Smoker    Packs/day: 0.25    Years: 20.00    Types: Cigarettes    Quit date: 08/31/1995  . Smokeless tobacco: Never Used  . Alcohol use No  . Drug use: No  . Sexual activity: Not on file   Other Topics Concern  . Not on file   Social History Narrative  . No narrative on file     Allergies  Allergen Reactions  . Ace Inhibitors Swelling  . Lisinopril Swelling     Outpatient Medications Prior to Visit  Medication  Sig Dispense Refill  . amLODipine (NORVASC) 10 MG tablet Take 10 mg by mouth daily.    . atenolol (TENORMIN) 50 MG tablet Take 50 mg by mouth daily.    . atorvastatin (LIPITOR) 80 MG tablet Take 80 mg by mouth daily.    . dorzolamide-timolol (COSOPT) 22.3-6.8 MG/ML ophthalmic solution Place 1 drop into both eyes 2 (two) times daily.    . furosemide (LASIX) 20 MG tablet Take 1 tablet (20 mg total) by mouth  daily. 30 tablet 4  . glucose blood test strip 1 each by Other route as needed for other (blood glucose). Use as instructed    . metFORMIN (GLUCOPHAGE) 500 MG tablet Take 500 mg by mouth 2 (two) times daily with a meal.    . OVER THE COUNTER MEDICATION Take 1 capsule by mouth daily. "Maxivision" eye vitamin    . potassium chloride SA (K-DUR,KLOR-CON) 20 MEQ tablet Take 2 tablets (40 mEq total) by mouth daily. 60 tablet 3  . PROAIR HFA 108 (90 Base) MCG/ACT inhaler INHALE ONE TO TWO PUFFS BY MOUTH EVERY 4 HOURS AS NEEDED FOR WHEEZING OR SHORTNESS OF BREATH 9 each 1  . spironolactone (ALDACTONE) 25 MG tablet TAKE ONE TABLET BY MOUTH ONCE DAILY 90 tablet 3  . tiotropium (SPIRIVA) 18 MCG inhalation capsule Place 1 capsule (18 mcg total) into inhaler and inhale daily. 30 capsule 12  . pantoprazole (PROTONIX) 40 MG tablet Take 1 tablet (40 mg total) by mouth 2 (two) times daily. 60 tablet 5   No facility-administered medications prior to visit.          Objective:   Physical Exam Vitals:   02/24/17 1356 02/24/17 1357  BP:  128/70  Pulse:  74  SpO2:  100%  Weight: 152 lb (68.9 kg)   Height: 5' 3" (1.6 m)    Gen: Pleasant, well-nourished, in no distress,  normal affect  ENT: No lesions,  mouth clear,  oropharynx clear, no postnasal drip  Neck: No JVD, no TMG, no carotid bruits  Lungs: No use of accessory muscles,clear B   Cardiovascular: RRR, heart sounds normal, no murmur or gallops, no peripheral edema  Musculoskeletal: No deformities, no cyanosis or clubbing  Neuro: alert, non focal  Skin: Warm, no lesions or rashes     Assessment & Plan:  GOLD D very severe COPD by GOLD classification (HCC) Please continue Spiriva as you are taking it Please continue albuterol as needed for shortness of breath Follow with Dr Byrum in 3 months or sooner if you have any problems.  OSA and COPD overlap syndrome (HCC) Will order CPAP for use every night, pressure 9 cmH2O. You will not need  to add your oxygen to this.   Chronic hypoxemic respiratory failure (HCC) Her oxygenation is improved with better volume status. No longer has desats w exertion  Pulmonary hypertension due to chronic obstructive pulmonary disease (HCC) Multifactorial pulmonary hypertension. Certainly COPD and obstructive sleep apnea are large contributor's. She also has some evidence on serologies for connective tissue disease. Rheumatology evaluation has not recommended medications at this time. We may have to revisit this issue if her hypertension after we treated the other underlying contributors.  Robert Byrum, MD, PhD 02/24/2017, 2:23 PM [facility] [phone] or if no answer [phone]  

## 2017-02-24 NOTE — Assessment & Plan Note (Signed)
Her oxygenation is improved with better volume status. No longer has desats w exertion

## 2017-02-24 NOTE — Patient Instructions (Signed)
Please continue Spiriva as you are taking it Please continue albuterol as needed for shortness of breath Will order CPAP for use every night, pressure 9 cmH2O. You will not need to add your oxygen to this.  Follow with Dr Lamonte Sakai in 3 months or sooner if you have any problems.

## 2017-02-24 NOTE — Assessment & Plan Note (Signed)
Will order CPAP for use every night, pressure 9 cmH2O. You will not need to add your oxygen to this.

## 2017-02-24 NOTE — Assessment & Plan Note (Signed)
Multifactorial pulmonary hypertension. Certainly COPD and obstructive sleep apnea are large contributor's. She also has some evidence on serologies for connective tissue disease. Rheumatology evaluation has not recommended medications at this time. We may have to revisit this issue if her hypertension after we treated the other underlying contributors.

## 2017-02-24 NOTE — Assessment & Plan Note (Signed)
Please continue Spiriva as you are taking it Please continue albuterol as needed for shortness of breath Follow with Dr Lamonte Sakai in 3 months or sooner if you have any problems.

## 2017-02-24 NOTE — Addendum Note (Signed)
Addended by: Desmond Dike C on: 02/24/2017 02:37 PM   Modules accepted: Orders

## 2017-05-05 ENCOUNTER — Encounter: Payer: Self-pay | Admitting: Emergency Medicine

## 2017-05-23 ENCOUNTER — Ambulatory Visit: Payer: Medicare Other | Admitting: Emergency Medicine

## 2017-06-01 ENCOUNTER — Ambulatory Visit (INDEPENDENT_AMBULATORY_CARE_PROVIDER_SITE_OTHER): Payer: Medicare Other | Admitting: Emergency Medicine

## 2017-06-01 ENCOUNTER — Encounter: Payer: Self-pay | Admitting: Emergency Medicine

## 2017-06-01 DIAGNOSIS — G4733 Obstructive sleep apnea (adult) (pediatric): Secondary | ICD-10-CM | POA: Diagnosis not present

## 2017-06-01 DIAGNOSIS — J449 Chronic obstructive pulmonary disease, unspecified: Secondary | ICD-10-CM | POA: Diagnosis not present

## 2017-06-01 DIAGNOSIS — Z23 Encounter for immunization: Secondary | ICD-10-CM | POA: Diagnosis not present

## 2017-06-01 DIAGNOSIS — J9611 Chronic respiratory failure with hypoxia: Secondary | ICD-10-CM

## 2017-06-01 NOTE — Patient Instructions (Signed)
Please continue using your CPAP every night as you have been doing Continue Spiriva once a day Continue albuterol 2 puffs as needed for shortness of breath Flu shot today.  Follow with Dr Lamonte Sakai in 6 months or sooner if you have any problems

## 2017-06-01 NOTE — Progress Notes (Signed)
Subjective:    Patient ID: Kathleen Becker, female    DOB: [birth year], 71 y.o.   MRN: [id number]  HPI 71 year old female with PMH of HTN And diastolic CHF, HLD, Type 2 Diabetes, CHF, former smoker, severe pulmonary HTN. I met her when she was admitted to the hospital in mid November 2017. She has an echocardiogram from 06/08/2016 that showed a vigorous EF, diastolic dysfunction, LVH, normal size and function. She had newly diagnosed connective tissue disease during the hospitalization with an ANA in a centromere pattern. She was discharged on oxygen 4L/min, spiriva. She is no longer smoking. She also had anemia during that hospitalization - has followed with Dr Cristina Gong.   She underwent PFT 07/14/16 that I personally reviewed. These identified very severe obstruction with an FEV1 of 0.52 L (30%). No volumes done, probable superimposed restriction. No sleep study.   ROV 11/05/16 -- This follow-up visit for severe obstructive lung disease and multifactorial severe pulmonary hypertension. She has a history of connective tissue disease. She underwent a sleep study on 10/01/16 that showed mild obstructive sleep apnea with associated desaturations on 2 L/m. She has OSA COPD overlap syndrome. It was recommended that she return for CPAP or BiPAP titration study. At her last visit we continued Spiriva and albuterol when necessary. She does not feel dyspneic even off O2.   ROV 02/24/17 -- Is a history of severe obstructive lung disease, OSA, connective tissue disease with a positive ANA multifactorial severe pulmonary arterial hypertension. Since last visit we have performed a CPAP/BiPAP titration study. This recommended initiation of CPAP at 9 cm H2O. She is on Spiriva, pro-air 1-2x a day. She is not on immunomodulatory drugs right now. She feels better, less SOB, no edema (on lasix).   ROV 06/01/17 -- Follow-up visit for severe obstructive lung disease, obstructive sleep apnea, connective tissue disease with a  positive ANA, obstructive sleep apnea. She has multifactorial severe pulmonary arterial hypertension. She is not currently on immunosuppressive medication or targeted pulmonary hypertension therapy. She has been wearing her CPAP reliably. She believes that it helps her - less sleepy, more energy. No equipment needs. She is currently on Spiriva. She uses albuterol about once a day.     Review of Systems  Constitutional: Negative for fever and unexpected weight change.  HENT: Negative for congestion, dental problem, ear pain, nosebleeds, postnasal drip, rhinorrhea, sinus pressure, sneezing, sore throat and trouble swallowing.   Eyes: Negative for redness and itching.  Respiratory: Negative for chest tightness and wheezing.   Cardiovascular: Negative for palpitations and leg swelling.  Gastrointestinal: Negative for nausea and vomiting.  Genitourinary: Negative for dysuria.  Musculoskeletal: Negative for joint swelling.  Skin: Negative for rash.  Neurological: Negative for headaches.  Hematological: Does not bruise/bleed easily.  Psychiatric/Behavioral: Negative for dysphoric mood. The patient is not nervous/anxious.    As per HPI  Past Medical History:  Diagnosis Date  . Diabetes mellitus without complication ([place])   . 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 High cholesterol   . Hypertension      No family history on file.   Social History   Social History  . Marital status: Divorced    Spouse name: N/A  . Number of children: N/A  . Years of education: N/A   Occupational History  . Not on file.   Social History Main Topics  . Smoking status: Former Smoker    Packs/day: 0.25    Years: 20.00    Types: Cigarettes  Quit date: 08/31/1995  . Smokeless tobacco: Never Used  . Alcohol use No  . Drug use: No  . Sexual activity: Not on file   Other Topics Concern  . Not on file   Social History Narrative  . No narrative on file     Allergies  Allergen Reactions  . Ace  Inhibitors Swelling  . Lisinopril Swelling     Outpatient Medications Prior to Visit  Medication Sig Dispense Refill  . amLODipine (NORVASC) 10 MG tablet Take 10 mg by mouth daily.    Marland Kitchen atenolol (TENORMIN) 50 MG tablet Take 50 mg by mouth daily.    Marland Kitchen atorvastatin (LIPITOR) 80 MG tablet Take 80 mg by mouth daily.    . dorzolamide-timolol (COSOPT) 22.3-6.8 MG/ML ophthalmic solution Place 1 drop into both eyes 2 (two) times daily.    . furosemide (LASIX) 20 MG tablet Take 1 tablet (20 mg total) by mouth daily. 30 tablet 4  . glucose blood test strip 1 each by Other route as needed for other (blood glucose). Use as instructed    . OVER THE COUNTER MEDICATION Take 1 capsule by mouth daily. "Maxivision" eye vitamin    . PROAIR HFA 108 (90 Base) MCG/ACT inhaler INHALE ONE TO TWO PUFFS BY MOUTH EVERY 4 HOURS AS NEEDED FOR WHEEZING OR SHORTNESS OF BREATH 9 each 1  . spironolactone (ALDACTONE) 25 MG tablet TAKE ONE TABLET BY MOUTH ONCE DAILY 90 tablet 3  . tiotropium (SPIRIVA) 18 MCG inhalation capsule Place 1 capsule (18 mcg total) into inhaler and inhale daily. 30 capsule 12  . metFORMIN (GLUCOPHAGE) 500 MG tablet Take 500 mg by mouth 2 (two) times daily with a meal.    . potassium chloride SA (K-DUR,KLOR-CON) 20 MEQ tablet Take 2 tablets (40 mEq total) by mouth daily. 60 tablet 3   No facility-administered medications prior to visit.          Objective:   Physical Exam Vitals:   06/01/17 1622 06/01/17 1623  BP:  132/72  Pulse:  67  SpO2:  99%  Weight: 70.3 kg (155 lb)   Height: 5' 3" (1.6 m)    Gen: Pleasant, well-nourished, in no distress,  normal affect  ENT: No lesions,  mouth clear,  oropharynx clear, no postnasal drip  Neck: No JVD, no TMG, no carotid bruits  Lungs: No use of accessory muscles,clear B   Cardiovascular: RRR, heart sounds normal, no murmur or gallops, no peripheral edema  Musculoskeletal: No deformities, no cyanosis or clubbing  Neuro: alert, non  focal  Skin: Warm, no lesions or rashes     Assessment & Plan:  OSA and COPD overlap syndrome (HCC) Tolerating CPAP. Clinically benefiting. Great compliance-wears it every night for greater than 4 hours. Less daytime sleepiness, more energy.  GOLD D very severe COPD by GOLD classification ([place]) Continue Spiriva Continue albuterol as needed Flu shot today  Chronic hypoxemic respiratory failure (HCC) No longer requires oxygen. Continue to follow clinically. Likely recheck her exertional oximetry at least annually  Baltazar Apo, MD, PhD 06/01/2017, 4:38 PM [facility] [phone] or if no answer [phone]

## 2017-06-01 NOTE — Assessment & Plan Note (Signed)
Tolerating CPAP. Clinically benefiting. Great compliance-wears it every night for greater than 4 hours. Less daytime sleepiness, more energy.

## 2017-06-01 NOTE — Assessment & Plan Note (Addendum)
No longer requires oxygen. Continue to follow clinically. Likely recheck her exertional oximetry at least annually

## 2017-06-01 NOTE — Assessment & Plan Note (Signed)
Continue Spiriva Continue albuterol as needed Flu shot today

## 2017-08-16 ENCOUNTER — Telehealth: Payer: Self-pay | Admitting: Emergency Medicine

## 2017-08-16 MED ORDER — TIOTROPIUM BROMIDE MONOHYDRATE 18 MCG IN CAPS: 18.0000 ug | ORAL_CAPSULE | Freq: Every day | RESPIRATORY_TRACT | 3 refills | Status: DC

## 2017-08-16 NOTE — Telephone Encounter (Signed)
Spoke with pt and advised rx Spiriva sent to pharmacy. Nothing further is needed.

## 2017-11-28 ENCOUNTER — Ambulatory Visit: Payer: Medicare Other | Admitting: Emergency Medicine

## 2017-11-28 ENCOUNTER — Encounter: Payer: Self-pay | Admitting: Emergency Medicine

## 2017-11-28 VITALS — BP 128/80 | HR 76 | Ht 64.0 in | Wt 160.0 lb

## 2017-11-28 DIAGNOSIS — I2723 Pulmonary hypertension due to lung diseases and hypoxia: Secondary | ICD-10-CM

## 2017-11-28 DIAGNOSIS — G4733 Obstructive sleep apnea (adult) (pediatric): Secondary | ICD-10-CM | POA: Diagnosis not present

## 2017-11-28 DIAGNOSIS — J449 Chronic obstructive pulmonary disease, unspecified: Secondary | ICD-10-CM

## 2017-11-28 NOTE — Progress Notes (Signed)
Subjective:    Patient ID: Kathleen Becker, female    DOB: 1946/07/29, 72 y.o.   MRN: [id number]  HPI 72 year old female with PMH of HTN And diastolic CHF, HLD, Type 2 Diabetes, CHF, former smoker, severe pulmonary HTN. I met her when she was admitted to the hospital in mid November 2017. She has an echocardiogram from 06/08/2016 that showed a vigorous EF, diastolic dysfunction, LVH, normal size and function. She had newly diagnosed connective tissue disease during the hospitalization with an ANA in a centromere pattern. She was discharged on oxygen 4L/min, spiriva. She is no longer smoking. She also had anemia during that hospitalization - has followed with Dr Cristina Gong.   She underwent PFT 07/14/16 that I personally reviewed. These identified very severe obstruction with an FEV1 of 0.52 L (30%). No volumes done, probable superimposed restriction. No sleep study.   ROV 11/05/16 -- This follow-up visit for severe obstructive lung disease and multifactorial severe pulmonary hypertension. She has a history of connective tissue disease. She underwent a sleep study on 10/01/16 that showed mild obstructive sleep apnea with associated desaturations on 2 L/m. She has OSA COPD overlap syndrome. It was recommended that she return for CPAP or BiPAP titration study. At her last visit we continued Spiriva and albuterol when necessary. She does not feel dyspneic even off O2.   ROV 02/24/17 -- Is a history of severe obstructive lung disease, OSA, connective tissue disease with a positive ANA multifactorial severe pulmonary arterial hypertension. Since last visit we have performed a CPAP/BiPAP titration study. This recommended initiation of CPAP at 9 cm H2O. She is on Spiriva, pro-air 1-2x a day. She is not on immunomodulatory drugs right now. She feels better, less SOB, no edema (on lasix).   ROV 06/01/17 -- Follow-up visit for severe obstructive lung disease, obstructive sleep apnea, connective tissue disease with a  positive ANA, obstructive sleep apnea. She has multifactorial severe pulmonary arterial hypertension. She is not currently on immunosuppressive medication or targeted pulmonary hypertension therapy. She has been wearing her CPAP reliably. She believes that it helps her - less sleepy, more energy. No equipment needs. She is currently on Spiriva. She uses albuterol about once a day.   ROV 11/28/17 --follow-up visit for 72 year old woman with a history of severe secondary pulmonary hypertension in the setting of severe obstructive lung disease, systemic hypertension with diastolic dysfunction, obstructive sleep apnea, connective tissue disease with a positive ANA. Her original TTE was done in [place]. She no longer requires oxygen. She is tolerating CPAP well, wears it every night. She remains on Spiriva. She uses albuterol about a few times a week. No longer sees Rheumatology.    Review of Systems  Constitutional: Negative for fever and unexpected weight change.  HENT: Negative for congestion, dental problem, ear pain, nosebleeds, postnasal drip, rhinorrhea, sinus pressure, sneezing, sore throat and trouble swallowing.   Eyes: Negative for redness and itching.  Respiratory: Negative for chest tightness and wheezing.   Cardiovascular: Negative for palpitations and leg swelling.  Gastrointestinal: Negative for nausea and vomiting.  Genitourinary: Negative for dysuria.  Musculoskeletal: Negative for joint swelling.  Skin: Negative for rash.  Neurological: Negative for headaches.  Hematological: Does not bruise/bleed easily.  Psychiatric/Behavioral: Negative for dysphoric mood. The patient is not nervous/anxious.    As per HPI  Past Medical History:  Diagnosis Date  . Diabetes mellitus without complication ([place])   . 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 High cholesterol   . Hypertension  No family history on file.   Social History   Socioeconomic History  . Marital status: Divorced      Spouse name: Not on file  . Number of children: Not on file  . Years of education: Not on file  . Highest education level: Not on file  Occupational History  . Not on file  Social Needs  . Financial resource strain: Not on file  . Food insecurity:    Worry: Not on file    Inability: Not on file  . Transportation needs:    Medical: Not on file    Non-medical: Not on file  Tobacco Use  . Smoking status: Former Smoker    Packs/day: 0.25    Years: 20.00    Pack years: 5.00    Types: Cigarettes    Last attempt to quit: 08/31/1995    Years since quitting: 22.2  . Smokeless tobacco: Never Used  Substance and Sexual Activity  . Alcohol use: No  . Drug use: No  . Sexual activity: Not on file  Lifestyle  . Physical activity:    Days per week: Not on file    Minutes per session: Not on file  . Stress: Not on file  Relationships  . Social connections:    Talks on phone: Not on file    Gets together: Not on file    Attends religious service: Not on file    Active member of club or organization: Not on file    Attends meetings of clubs or organizations: Not on file    Relationship status: Not on file  . Intimate partner violence:    Fear of current or ex partner: Not on file    Emotionally abused: Not on file    Physically abused: Not on file    Forced sexual activity: Not on file  Other Topics Concern  . Not on file  Social History Narrative  . Not on file     Allergies  Allergen Reactions  . Ace Inhibitors Swelling  . Lisinopril Swelling     Outpatient Medications Prior to Visit  Medication Sig Dispense Refill  . amLODipine (NORVASC) 10 MG tablet Take 10 mg by mouth daily.    Marland Kitchen atenolol (TENORMIN) 50 MG tablet Take 50 mg by mouth daily.    Marland Kitchen atorvastatin (LIPITOR) 80 MG tablet Take 80 mg by mouth daily.    . dorzolamide-timolol (COSOPT) 22.3-6.8 MG/ML ophthalmic solution Place 1 drop into both eyes 2 (two) times daily.    . ferrous sulfate 325 (65 FE) MG tablet  Take 325 mg by mouth daily with breakfast.    . furosemide (LASIX) 20 MG tablet Take 1 tablet (20 mg total) by mouth daily. 30 tablet 4  . glucose blood test strip 1 each by Other route as needed for other (blood glucose). Use as instructed    . OVER THE COUNTER MEDICATION Take 1 capsule by mouth daily. "Maxivision" eye vitamin    . PROAIR HFA 108 (90 Base) MCG/ACT inhaler INHALE ONE TO TWO PUFFS BY MOUTH EVERY 4 HOURS AS NEEDED FOR WHEEZING OR SHORTNESS OF BREATH 9 each 1  . spironolactone (ALDACTONE) 25 MG tablet TAKE ONE TABLET BY MOUTH ONCE DAILY 90 tablet 3  . tiotropium (SPIRIVA) 18 MCG inhalation capsule Place 1 capsule (18 mcg total) into inhaler and inhale daily. 30 capsule 3   No facility-administered medications prior to visit.          Objective:   Physical Exam Vitals:  11/28/17 1529 11/28/17 1530  BP:  128/80  Pulse:  76  SpO2:  100%  Weight: 160 lb (72.6 kg)   Height: _0  (1.626 m)    Gen: Pleasant, well-nourished, in no distress,  normal affect  ENT: No lesions,  mouth clear,  oropharynx clear, no postnasal drip  Neck: No JVD, no TMG, no carotid bruits  Lungs: No use of accessory muscles,clear B   Cardiovascular: RRR, heart sounds normal, no murmur or gallops, no peripheral edema  Musculoskeletal: No deformities, no cyanosis or clubbing  Neuro: alert, non focal  Skin: Warm, no lesions or rashes     Assessment & Plan:  GOLD D very severe COPD by GOLD classification ([employer]) Please continue Spiriva daily Keep albuterol to use 2 puffs up to every 4 hours if needed for shortness of breath.  Follow with Dr Lamonte Sakai in 3-4 months after your echo to review, or sooner if you have any problems.  Chronic hypoxemic respiratory failure (HCC) Markedly improved, no longer needs O2. ? Better due to correction of her underlying contributors to her PAH.   Pulmonary hypertension due to chronic obstructive pulmonary disease (HCC) We will recheck her TTE to compare with  prior. Will need to get this from [place]  OSA and COPD overlap syndrome ([place]) Tolerating CPAP well, continue same mask and pressures. Good clinical benefit.   Baltazar Apo, MD, PhD 11/28/2017, 4:06 PM [facility] [phone] or if no answer [phone]

## 2017-11-28 NOTE — Patient Instructions (Addendum)
We will perform an echocardiogram to compare with your prior. This will allow [place] to check heart function, strength and pulmonary pressures. We can do this in June in [place].  Please continue CPAP every night Please continue Spiriva daily Keep albuterol to use 2 puffs up to every 4 hours if needed for shortness of breath.  Follow with Dr Lamonte Sakai in 3-4 months after your echo to review, or sooner if you have any problems.

## 2017-11-28 NOTE — Assessment & Plan Note (Signed)
We will recheck her TTE to compare with prior. Will need to get this from [place]

## 2017-11-28 NOTE — Assessment & Plan Note (Signed)
Please continue Spiriva daily Keep albuterol to use 2 puffs up to every 4 hours if needed for shortness of breath.  Follow with Dr Lamonte Sakai in 3-4 months after your echo to review, or sooner if you have any problems.

## 2017-11-28 NOTE — Assessment & Plan Note (Signed)
Markedly improved, no longer needs O2. ? Better due to correction of her underlying contributors to her PAH.

## 2017-11-28 NOTE — Assessment & Plan Note (Signed)
Tolerating CPAP well, continue same mask and pressures. Good clinical benefit.

## 2018-02-13 ENCOUNTER — Other Ambulatory Visit: Payer: Self-pay

## 2018-02-13 ENCOUNTER — Ambulatory Visit (HOSPITAL_COMMUNITY): Payer: Medicare Other | Attending: Cardiology

## 2018-02-13 DIAGNOSIS — I2723 Pulmonary hypertension due to lung diseases and hypoxia: Secondary | ICD-10-CM

## 2018-02-13 DIAGNOSIS — E785 Hyperlipidemia, unspecified: Secondary | ICD-10-CM | POA: Insufficient documentation

## 2018-02-13 DIAGNOSIS — I110 Hypertensive heart disease with heart failure: Secondary | ICD-10-CM | POA: Diagnosis not present

## 2018-02-13 DIAGNOSIS — I509 Heart failure, unspecified: Secondary | ICD-10-CM | POA: Insufficient documentation

## 2018-02-13 DIAGNOSIS — J449 Chronic obstructive pulmonary disease, unspecified: Secondary | ICD-10-CM | POA: Diagnosis not present

## 2018-02-13 DIAGNOSIS — Z87891 Personal history of nicotine dependence: Secondary | ICD-10-CM | POA: Insufficient documentation

## 2018-02-13 DIAGNOSIS — E119 Type 2 diabetes mellitus without complications: Secondary | ICD-10-CM | POA: Insufficient documentation

## 2018-02-13 DIAGNOSIS — I272 Pulmonary hypertension, unspecified: Secondary | ICD-10-CM | POA: Diagnosis not present

## 2018-02-24 ENCOUNTER — Ambulatory Visit: Payer: Medicare Other | Admitting: Emergency Medicine

## 2018-02-24 ENCOUNTER — Encounter: Payer: Self-pay | Admitting: Emergency Medicine

## 2018-02-24 DIAGNOSIS — J449 Chronic obstructive pulmonary disease, unspecified: Secondary | ICD-10-CM

## 2018-02-24 DIAGNOSIS — J9611 Chronic respiratory failure with hypoxia: Secondary | ICD-10-CM | POA: Diagnosis not present

## 2018-02-24 DIAGNOSIS — G4733 Obstructive sleep apnea (adult) (pediatric): Secondary | ICD-10-CM

## 2018-02-24 DIAGNOSIS — I2723 Pulmonary hypertension due to lung diseases and hypoxia: Secondary | ICD-10-CM | POA: Diagnosis not present

## 2018-02-24 NOTE — Patient Instructions (Signed)
Please continue to use your CPAP every night as you have been doing. Continue Spiriva once daily. Keep albuterol available to use 2 puffs if needed for shortness of breath Continue your blood pressure medications as prescribed. Your echocardiogram showed that your pulmonary pressures and your heart function are much improved compared with several years ago.  This is because your contributing underlying problems are all improved. Follow with Dr Lamonte Sakai in 12 months or sooner if you have any problems

## 2018-02-24 NOTE — Assessment & Plan Note (Signed)
No longer requiring supplemental oxygen.  Remarkable improvement since we have treated the underlying contributors to her secondary pulmonary hypertension.

## 2018-02-24 NOTE — Assessment & Plan Note (Signed)
Multifactorial secondary pulmonary hypertension.  Improved as per echocardiogram As detailed above.  We will need to maintain surveillance as she does have a positive ANA and the potential for contribution from connective tissue disease.  No evidence of this as of now.

## 2018-02-24 NOTE — Progress Notes (Signed)
Subjective:    Patient ID: Kathleen Becker, female    DOB: 04/04/1946, 72 y.o.   MRN: [id number]  HPI 72 year old female with PMH of HTN And diastolic CHF, HLD, Type 2 Diabetes, CHF, former smoker, severe pulmonary HTN. I met her when she was admitted to the hospital in mid November 2017. She has an echocardiogram from 06/08/2016 that showed a vigorous EF, diastolic dysfunction, LVH, normal size and function. She had newly diagnosed connective tissue disease during the hospitalization with an ANA in a centromere pattern. She was discharged on oxygen 4L/min, spiriva. She is no longer smoking. She also had anemia during that hospitalization - has followed with Dr Cristina Gong.   She underwent PFT 07/14/16 that I personally reviewed. These identified very severe obstruction with an FEV1 of 0.52 L (30%). No volumes done, probable superimposed restriction. No sleep study.   ROV 11/05/16 -- This follow-up visit for severe obstructive lung disease and multifactorial severe pulmonary hypertension. She has a history of connective tissue disease. She underwent a sleep study on 10/01/16 that showed mild obstructive sleep apnea with associated desaturations on 2 L/m. She has OSA COPD overlap syndrome. It was recommended that she return for CPAP or BiPAP titration study. At her last visit we continued Spiriva and albuterol when necessary. She does not feel dyspneic even off O2.   ROV 02/24/17 -- Is a history of severe obstructive lung disease, we performed an echocardiogram on 02/13/2018.  This shows OSA, connective tissue disease with a positive ANA multifactorial severe pulmonary arterial hypertension. Since last visit we have performed a CPAP/BiPAP titration study. This recommended initiation of CPAP at 9 cm H2O. She is on Spiriva, pro-air 1-2x a day. She is not on immunomodulatory drugs right now. She feels better, less SOB, no edema (on lasix).   ROV 06/01/17 -- Follow-up visit for severe obstructive lung disease,  obstructive sleep apnea, connective tissue disease with a positive ANA, obstructive sleep apnea. She has multifactorial severe pulmonary arterial hypertension. She is not currently on immunosuppressive medication or targeted pulmonary hypertension therapy. She has been wearing her CPAP reliably. She believes that it helps her - less sleepy, more energy. No equipment needs. She is currently on Spiriva. She uses albuterol about once a day.   ROV 11/28/17 --follow-up visit for 72 year old woman with a history of severe secondary pulmonary hypertension in the setting of severe obstructive lung disease, systemic hypertension with diastolic dysfunction, obstructive sleep apnea, connective tissue disease with a positive ANA. Her original TTE was done in [place]. She no longer requires oxygen. She is tolerating CPAP well, wears it every night. She remains on Spiriva. She uses albuterol about a few times a week. No longer sees Rheumatology.   ROV 02/24/18 --72 year old woman with severe secondary plantar hypertension with contributions from her severe obstructive lung disease, systemic hypertension with diastolic dysfunction, obstructive sleep apnea.  She also has presumed connective tissue disease with a positive ANA.  She is clinically improved with treatment of her obstructive disease, hypertension, and sleep apnea.  We performed an echocardiogram on 02/13/2018 which shows intact left ventricular function, grade 1 diastolic dysfunction.  Her estimated PASP was 38 mmHg with normal-sized right ventricle and right ventricular function. She has been well. Notes that she is following with nephrology in [place], renal insuf and also hypokalemia. She is no longer on O2. She remains on Spiriva. Never uses her albuterol. Great compliance with her CPAP every night - gets a definite clinical benefit.  Review of Systems  Constitutional: Negative for fever and unexpected weight change.  HENT: Negative for congestion, dental  problem, ear pain, nosebleeds, postnasal drip, rhinorrhea, sinus pressure, sneezing, sore throat and trouble swallowing.   Eyes: Negative for redness and itching.  Respiratory: Negative for chest tightness and wheezing.   Cardiovascular: Negative for palpitations and leg swelling.  Gastrointestinal: Negative for nausea and vomiting.  Genitourinary: Negative for dysuria.  Musculoskeletal: Negative for joint swelling.  Skin: Negative for rash.  Neurological: Negative for headaches.  Hematological: Does not bruise/bleed easily.  Psychiatric/Behavioral: Negative for dysphoric mood. The patient is not nervous/anxious.    As per HPI  Past Medical History:  Diagnosis Date  . Diabetes mellitus without complication ([place])   . 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 High cholesterol   . Hypertension      No family history on file.   Social History   Socioeconomic History  . Marital status: Divorced    Spouse name: Not on file  . Number of children: Not on file  . Years of education: Not on file  . Highest education level: Not on file  Occupational History  . Not on file  Social Needs  . Financial resource strain: Not on file  . Food insecurity:    Worry: Not on file    Inability: Not on file  . Transportation needs:    Medical: Not on file    Non-medical: Not on file  Tobacco Use  . Smoking status: Former Smoker    Packs/day: 0.25    Years: 20.00    Pack years: 5.00    Types: Cigarettes    Last attempt to quit: 08/31/1995    Years since quitting: 22.5  . Smokeless tobacco: Never Used  Substance and Sexual Activity  . Alcohol use: No  . Drug use: No  . Sexual activity: Not on file  Lifestyle  . Physical activity:    Days per week: Not on file    Minutes per session: Not on file  . Stress: Not on file  Relationships  . Social connections:    Talks on phone: Not on file    Gets together: Not on file    Attends religious service: Not on file    Active member of club or  organization: Not on file    Attends meetings of clubs or organizations: Not on file    Relationship status: Not on file  . Intimate partner violence:    Fear of current or ex partner: Not on file    Emotionally abused: Not on file    Physically abused: Not on file    Forced sexual activity: Not on file  Other Topics Concern  . Not on file  Social History Narrative  . Not on file     Allergies  Allergen Reactions  . Ace Inhibitors Swelling  . Lisinopril Swelling     Outpatient Medications Prior to Visit  Medication Sig Dispense Refill  . amLODipine (NORVASC) 10 MG tablet Take 10 mg by mouth daily.    Marland Kitchen atenolol (TENORMIN) 50 MG tablet Take 50 mg by mouth daily.    Marland Kitchen atorvastatin (LIPITOR) 80 MG tablet Take 80 mg by mouth daily.    . dorzolamide-timolol (COSOPT) 22.3-6.8 MG/ML ophthalmic solution Place 1 drop into both eyes 2 (two) times daily.    . ferrous sulfate 325 (65 FE) MG tablet Take 325 mg by mouth daily with breakfast.    . furosemide (LASIX) 20 MG tablet  Take 1 tablet (20 mg total) by mouth daily. 30 tablet 4  . glucose blood test strip 1 each by Other route as needed for other (blood glucose). Use as instructed    . losartan (COZAAR) 25 MG tablet Take 25 mg by mouth daily.    Marland Kitchen OVER THE COUNTER MEDICATION Take 1 capsule by mouth daily. "Maxivision" eye vitamin    . PROAIR HFA 108 (90 Base) MCG/ACT inhaler INHALE ONE TO TWO PUFFS BY MOUTH EVERY 4 HOURS AS NEEDED FOR WHEEZING OR SHORTNESS OF BREATH 9 each 1  . spironolactone (ALDACTONE) 25 MG tablet TAKE ONE TABLET BY MOUTH ONCE DAILY 90 tablet 3  . tiotropium (SPIRIVA) 18 MCG inhalation capsule Place 1 capsule (18 mcg total) into inhaler and inhale daily. 30 capsule 3   No facility-administered medications prior to visit.          Objective:   Physical Exam Vitals:   02/24/18 1333  BP: 140/70  Pulse: 72  SpO2: 96%  Weight: 163 lb 6.4 oz (74.1 kg)  Height: _0  (1.626 m)   Gen: Pleasant, well-nourished, in  no distress,  normal affect  ENT: No lesions,  mouth clear,  oropharynx clear, no postnasal drip  Neck: No JVD, no stridor  Lungs: No use of accessory muscles, clear B, no crackles or wheezes   Cardiovascular: RRR, distant, no M, trace pedal peripheral edema  Musculoskeletal: No deformities, no cyanosis or clubbing  Neuro: alert, non focal  Skin: Warm, no lesions or rashes     Assessment & Plan:  OSA and COPD overlap syndrome (HCC) Great CPAP compliance.  She had clear clinical benefit.  Also her echocardiogram shows a significant improvement in her pulmonary pressures compared with 2 years ago.  Continue same  GOLD D very severe COPD by GOLD classification ([place]) Doing well on Spiriva.  Exertional tolerance is significantly improved, presumably because treatment of her OSA and COPD have decreased her pulmonary pressures.  Continue same regimen  Chronic hypoxemic respiratory failure (HCC) No longer requiring supplemental oxygen.  Remarkable improvement since we have treated the underlying contributors to her secondary pulmonary hypertension.  Pulmonary hypertension due to chronic obstructive pulmonary disease (HCC) Multifactorial secondary pulmonary hypertension.  Improved as per echocardiogram As detailed above.  We will need to maintain surveillance as she does have a positive ANA and the potential for contribution from connective tissue disease.  No evidence of this as of now.  Baltazar Apo, MD, PhD 02/24/2018, 1:50 PM [facility] [phone] or if no answer [phone]

## 2018-02-24 NOTE — Assessment & Plan Note (Signed)
Great CPAP compliance.  She had clear clinical benefit.  Also her echocardiogram shows a significant improvement in her pulmonary pressures compared with 2 years ago.  Continue same

## 2018-02-24 NOTE — Assessment & Plan Note (Signed)
Doing well on Spiriva.  Exertional tolerance is significantly improved, presumably because treatment of her OSA and COPD have decreased her pulmonary pressures.  Continue same regimen

## 2018-05-30 ENCOUNTER — Telehealth: Payer: Self-pay | Admitting: Emergency Medicine

## 2018-05-30 DIAGNOSIS — J9611 Chronic respiratory failure with hypoxia: Secondary | ICD-10-CM

## 2018-05-30 NOTE — Telephone Encounter (Signed)
Okay to write an order to discontinue her supplemental oxygen

## 2018-05-30 NOTE — Telephone Encounter (Signed)
Called and spoke with patient, advised that we have sent the order to d/c o2. Order sent. Nothing further needed.

## 2018-05-30 NOTE — Telephone Encounter (Signed)
Called and spoke with patient, she stated that she has not used her O2 in almost a year and would like for it to be d/c. Patient stated that [facility] will not come pick it up without order.   RB please advise, thank you.

## 2018-06-09 IMAGING — CR DG CHEST 1V PORT
1 series · 1 of 1 positions shown · non-contrast
Comparison: Report of a chest x-ray January 07, 2003

CLINICAL DATA: Two days of shortness of breath

EXAM:
PORTABLE CHEST 1 VIEW

[portable]
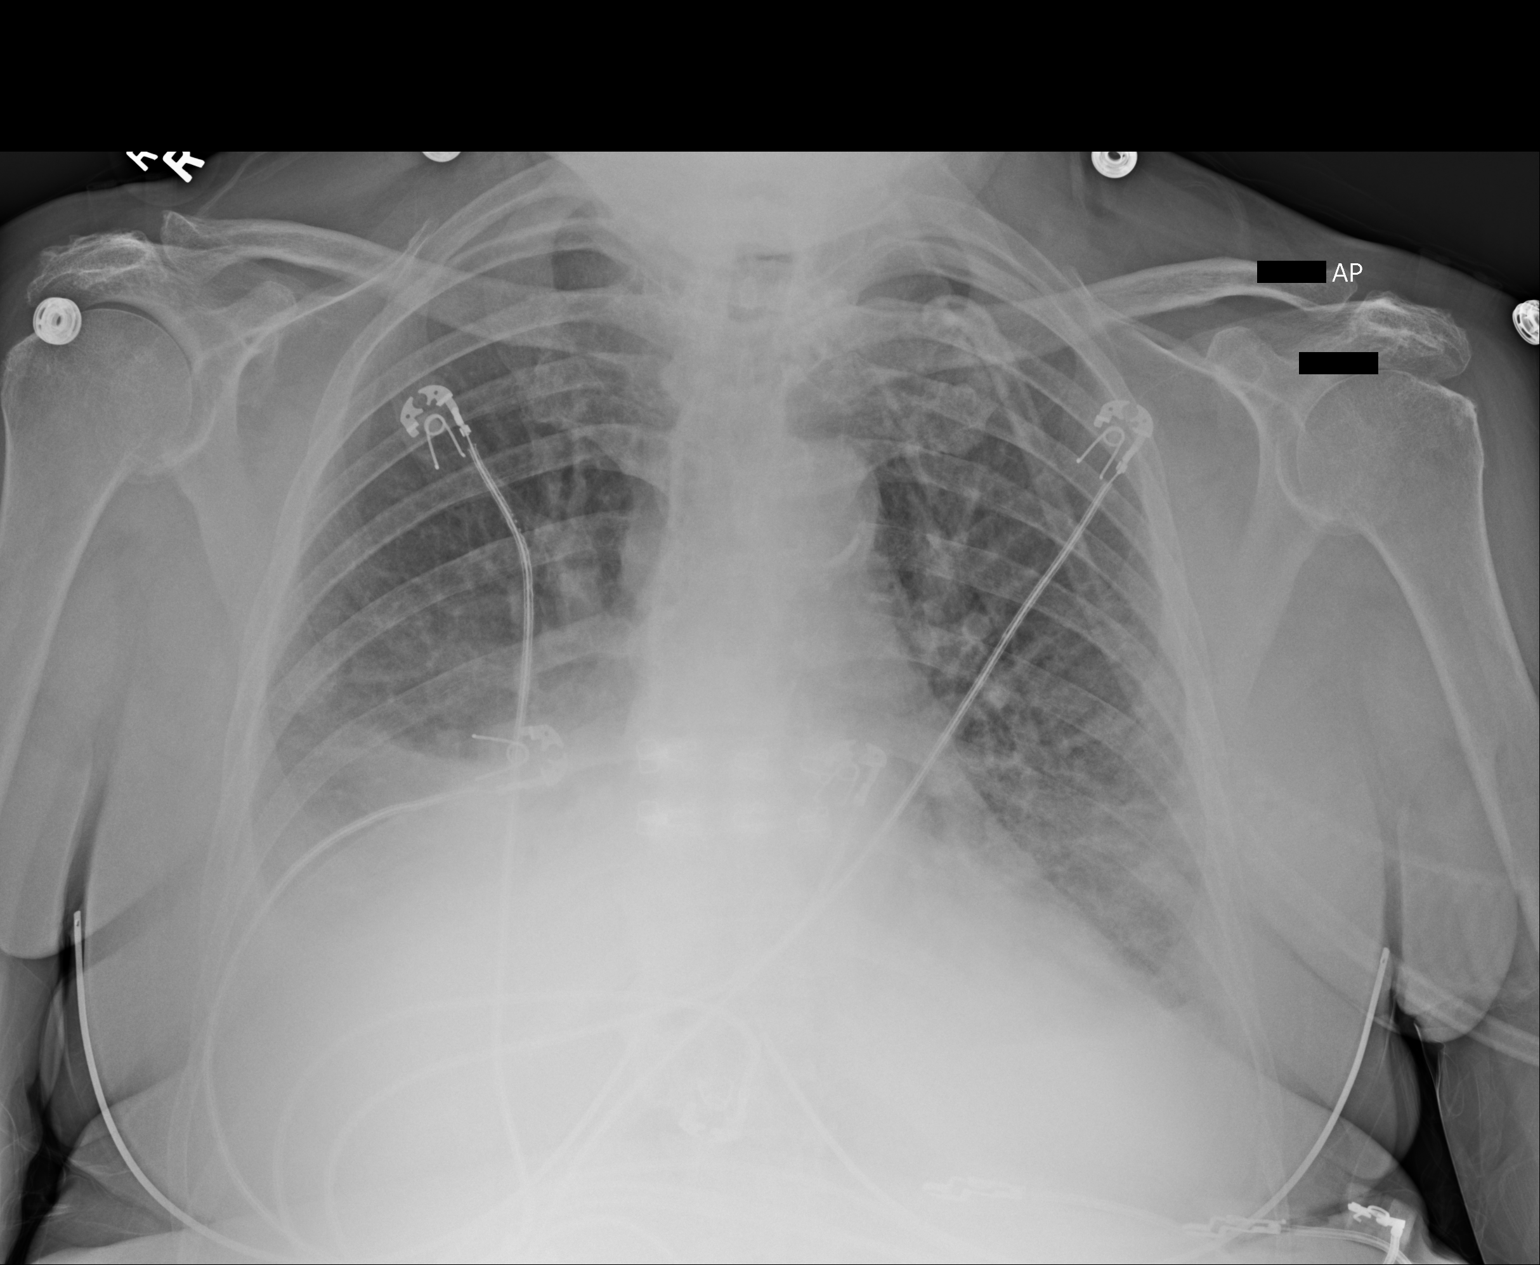

[1 of 1 positions shown; findings below may reference images not displayed]

FINDINGS: The lungs are adequately inflated. There is bibasilar increased
density. Pleural effusions are present greatest on the right. The
cardiac silhouette is enlarged. The pulmonary vascularity is
engorged. There is calcification in the wall of the aortic arch. The
observed bony thorax is unremarkable.
IMPRESSION: CHF with pulmonary interstitial edema and bilateral pleural
effusions greatest on the right. Bibasilar atelectasis or pneumonia
is suspected as well.

Thoracic aortic atherosclerosis.

## 2018-07-22 IMAGING — DX DG CHEST 1V PORT
1 series · 1 of 1 positions shown · non-contrast
Comparison: Portable chest x-ray of 07/13/2016

CLINICAL DATA: Short of breath, history of pulmonary hypertension

EXAM:
PORTABLE CHEST 1 VIEW

[chest ap]
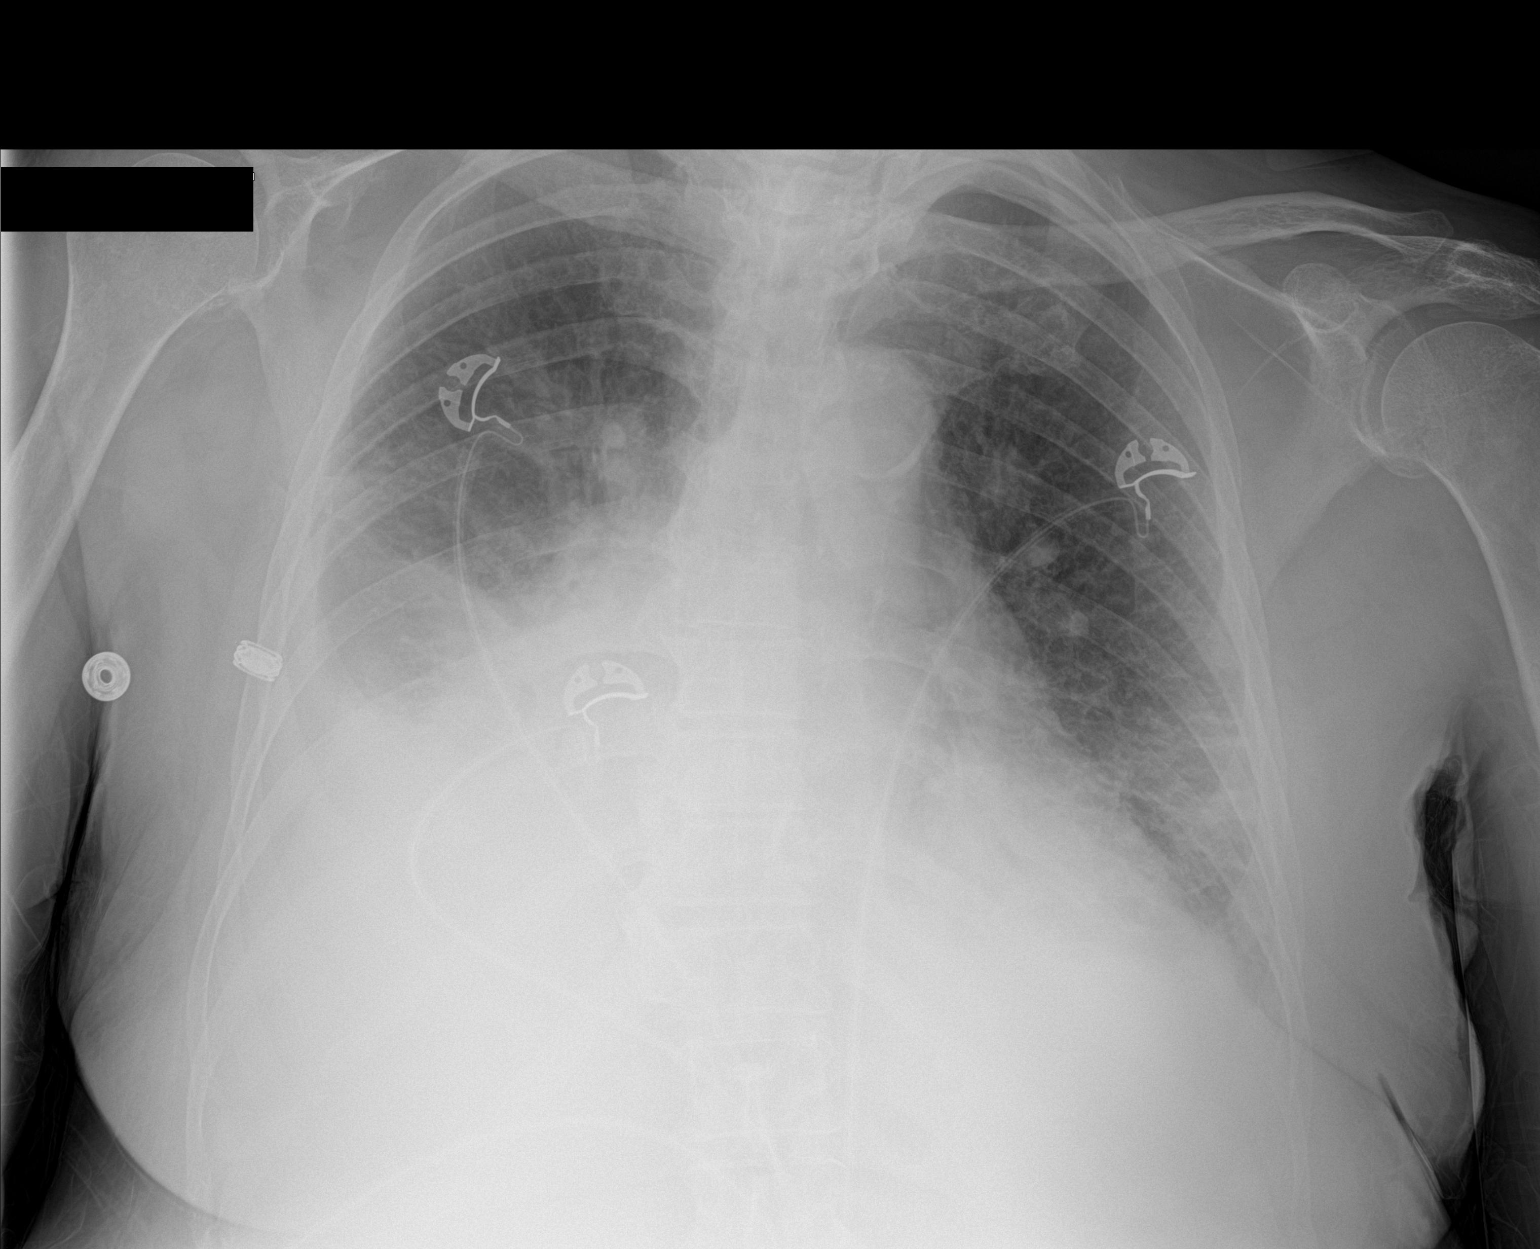

[1 of 1 positions shown; findings below may reference images not displayed]

FINDINGS: There is little change in poor aeration with probable bilateral
effusions and basilar atelectasis. With cardiomegaly these findings
most likely indicate CHF although pneumonia is difficult to exclude.
No bony abnormality is seen.
IMPRESSION: No significant change in poor aeration with effusions and
cardiomegaly suggestive of CHF.

## 2018-12-10 IMAGING — RF DG BE W/ AIR HIGH DENSITY
1 series · 14 of 24 positions shown · non-contrast
Comparison: None.

CLINICAL DATA: 70-year-old female with history of positive
colorectal cancer screening using DNA-based stool test.

EXAM:
AIR CONTRAST BARIUM ENEMA
TECHNIQUE: Initial scout AP supine abdominal image obtained to insure adequate
colon cleansing. Barium was introduced into the colon in a
retrograde fashion and refluxed from the rectum to the distal
transverse colon. As much of the barium as possible was then removed
through the indwelling tube via gravity drain. Air was then
insufflated into the colon. Spot images of the colon followed by
overhead radiographs were obtained.
FLUOROSCOPY TIME:  Fluoroscopy Time:  7 minutes
Radiation Exposure Index (if provided by the fluoroscopic device):
1,091 mGy

[Series 1: one shot · 14 of 24 slices shown]
[im 1/24]
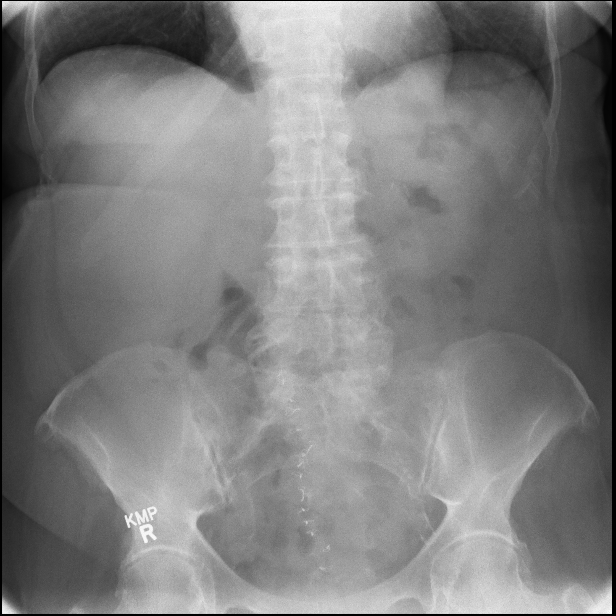
[im 3/24]
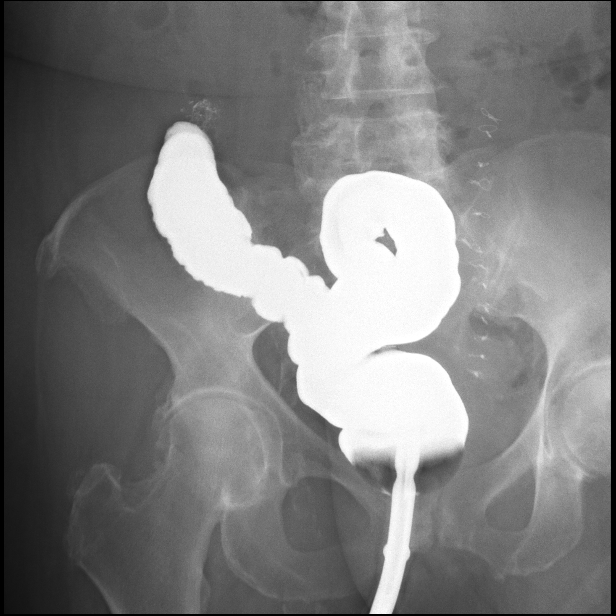
[im 5/24]
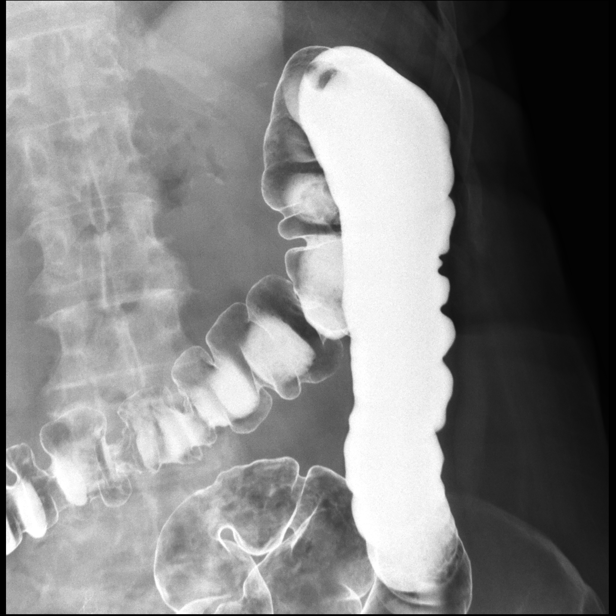
[im 7/24]
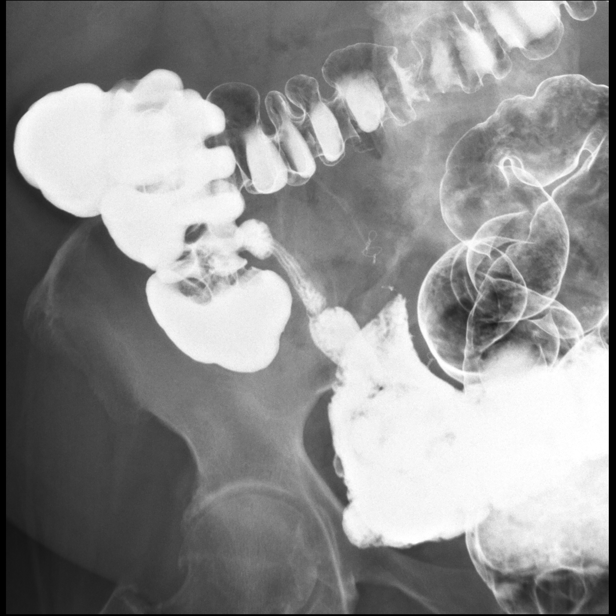
[im 8/24]
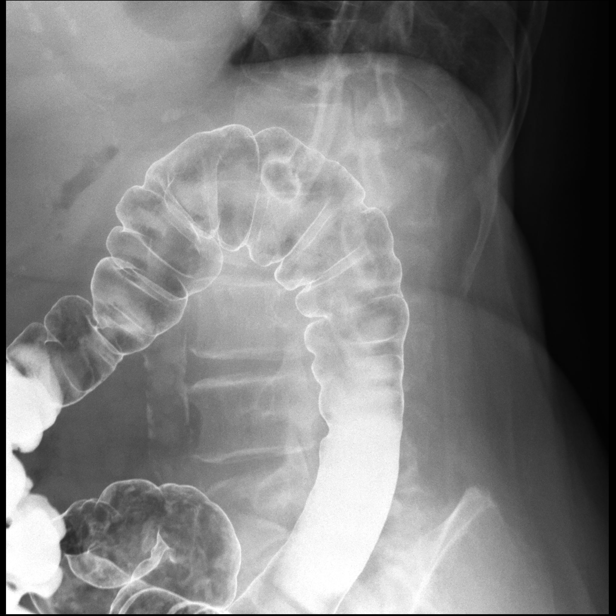
[im 10/24]
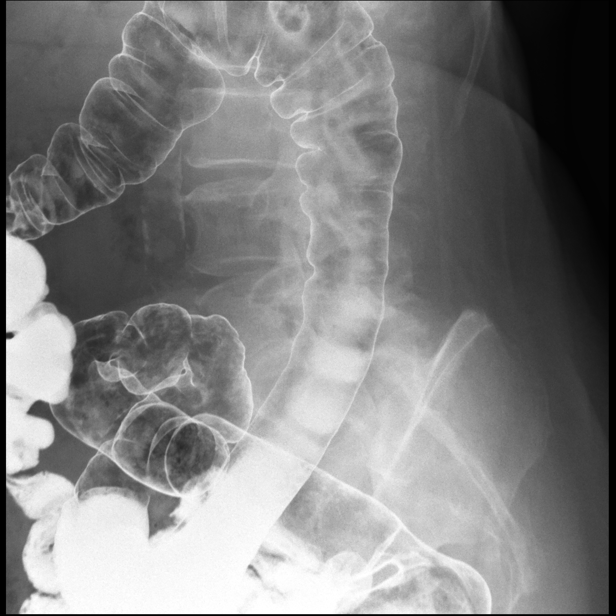
[im 12/24]
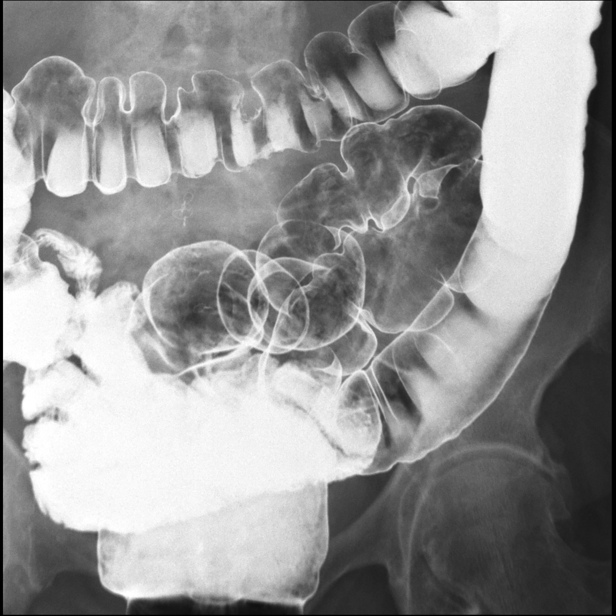
[im 13/24]
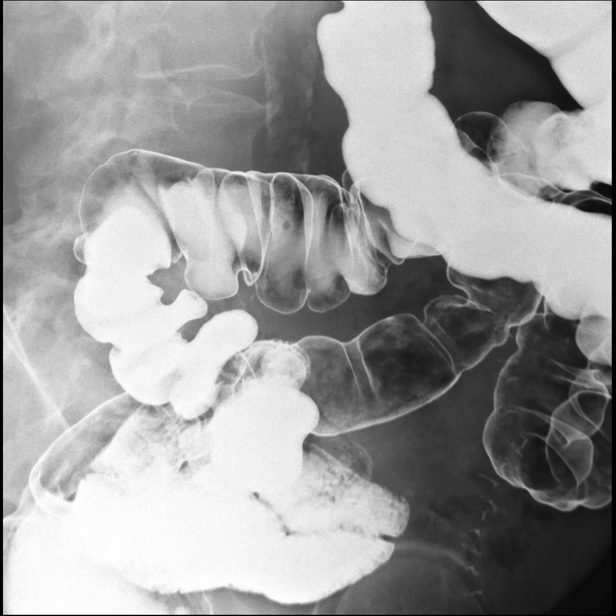
[im 15/24]
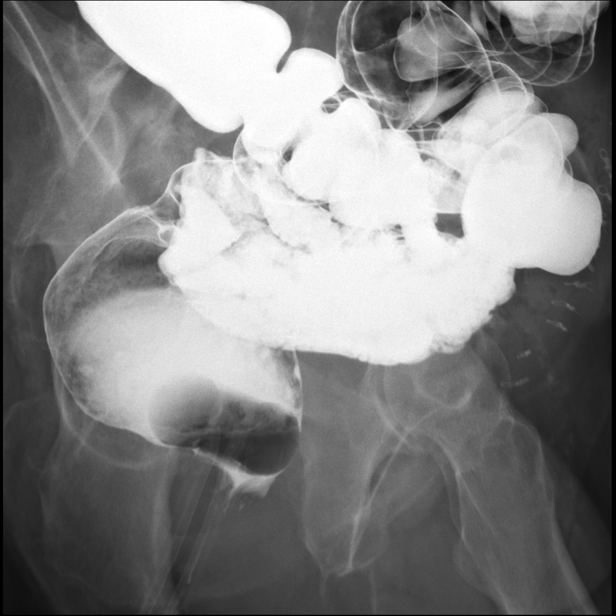
[im 17/24]
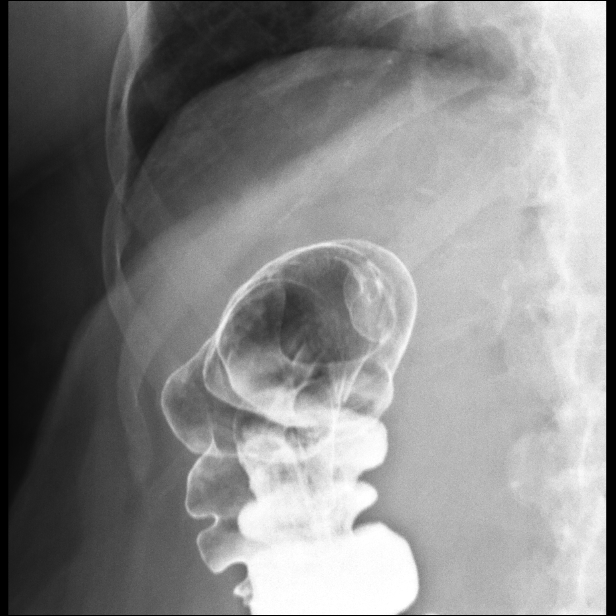
[im 19/24]
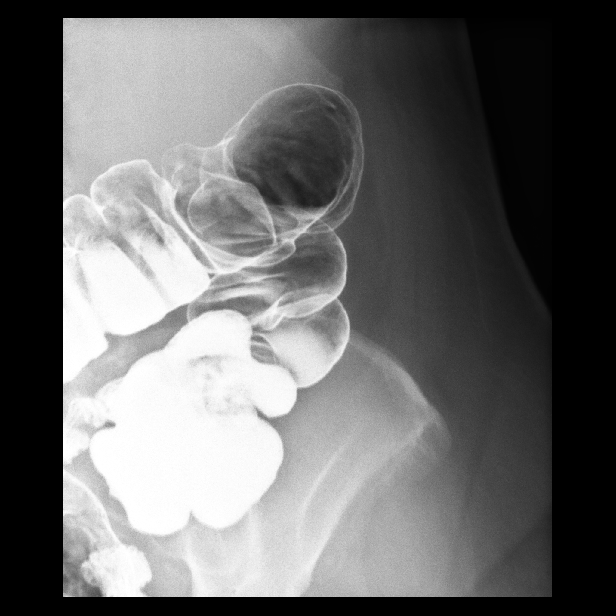
[im 20/24]
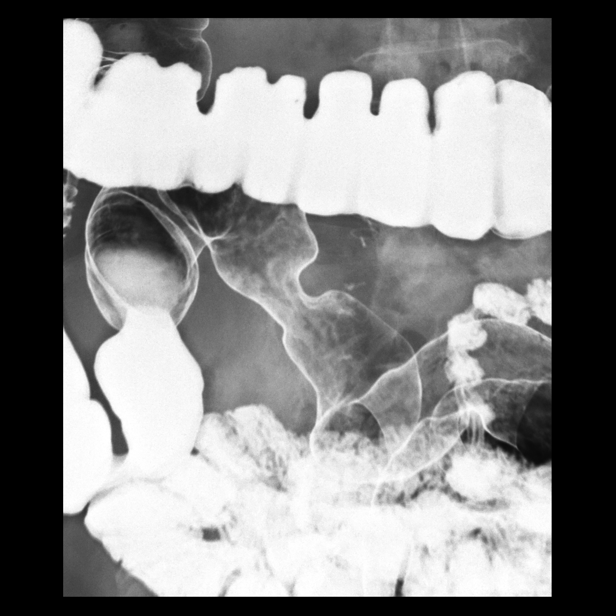
[im 22/24]
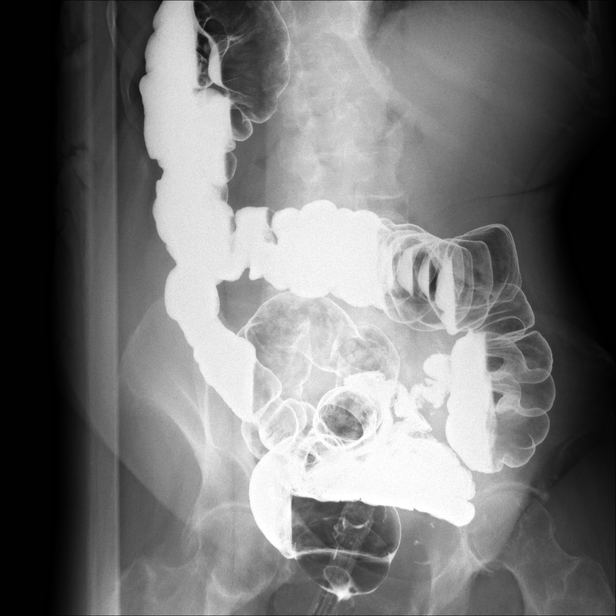
[im 24/24]
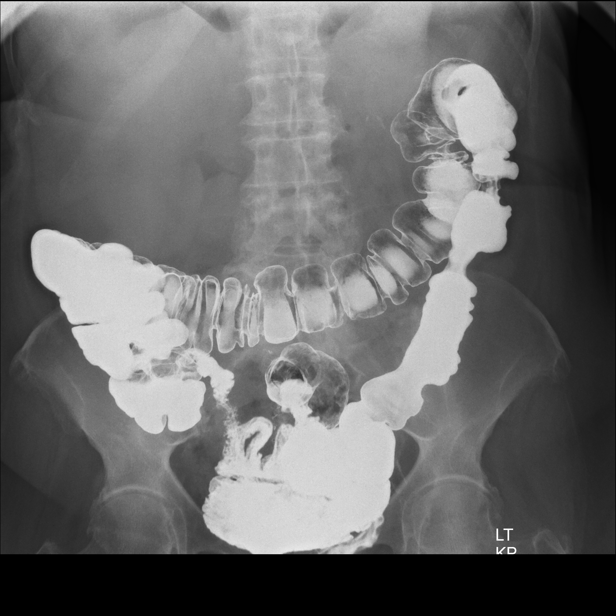

[14 of 24 positions shown; findings below may reference images not displayed]

FINDINGS: Initial KUB demonstrates midline suture material projecting over the
lower abdomen and anatomic pelvis. Nonobstructive bowel gas pattern.

Subsequently, double contrast barium enema was performed, which
demonstrated the presence of a pedunculated lesion in the region of
the splenic flexure, suspicious for small polyp. No other focal
lesions were noted. No diverticular disease identified. Barium did
extend to the cecum with a small amount of reflux through the
ileocecal valve.
IMPRESSION: 1. Small pedunculated polyp at the level of the splenic flexure.
Otherwise normal examination.

## 2022-11-05 ENCOUNTER — Encounter: Payer: Self-pay | Admitting: Pulmonary Disease

## 2022-11-05 ENCOUNTER — Ambulatory Visit: Payer: Medicare PPO | Admitting: Pulmonary Disease

## 2022-11-05 ENCOUNTER — Telehealth: Payer: Self-pay

## 2022-11-05 VITALS — BP 128/66 | HR 72 | Ht 63.0 in | Wt 151.0 lb

## 2022-11-05 DIAGNOSIS — J449 Chronic obstructive pulmonary disease, unspecified: Secondary | ICD-10-CM | POA: Diagnosis not present

## 2022-11-05 DIAGNOSIS — G4733 Obstructive sleep apnea (adult) (pediatric): Secondary | ICD-10-CM

## 2022-11-05 MED ORDER — SPIRIVA RESPIMAT 2.5 MCG/ACT IN AERS: 2.0000 | INHALATION_SPRAY | Freq: Every day | RESPIRATORY_TRACT | 11 refills | Status: DC

## 2022-11-05 MED ORDER — ALBUTEROL SULFATE HFA 108 (90 BASE) MCG/ACT IN AERS: INHALATION_SPRAY | RESPIRATORY_TRACT | 1 refills | Status: DC

## 2022-11-05 NOTE — Patient Instructions (Addendum)
Nice to meet you  I medicines prescription for Spiriva Respimat, 2 puffs twice a day every day.  If it is too expensive, do not pick it up and we will continue to work for an alternative, I will send a message to our pharmacy colleagues to make sure we  have the right information for you if needed  I will send a refill for your albuterol  For a refresher on how to use the Respimat, go to YouTube and search "[employer] how to use the Respimat"-this is a good video of a pharmacist show you how to use the medicine.  Return to clinic in 2 to 3 months, please schedule follow-up with Dr. Lamonte Sakai whom she previously was following with in our clinic.

## 2022-11-05 NOTE — Telephone Encounter (Signed)
Got a letter in the mail from [facility] that they are no longer covering [place]   Can we run a ticket to see what is covered  Thank you

## 2022-11-08 ENCOUNTER — Other Ambulatory Visit (HOSPITAL_COMMUNITY): Payer: Self-pay

## 2022-11-08 NOTE — Telephone Encounter (Signed)
Spiriva is currently covered by the patients plan, will need to wait until it is no longer covered before proceeding with PA. Last fill was 03/08 per test claim.

## 2022-11-10 NOTE — Progress Notes (Signed)
$'@Patient'g$  ID: Kathleen Becker, female    DOB: 1946/08/10, 77 y.o.   MRN: [id number]  Chief Complaint  Patient presents with   Consult    Pt is here for consult for acute resp failure while in the hospital. Pt is wanting to know if she could go back to Dr Lamonte Sakai after this visit. She states that she is doing better since getting out of hospital. Pt is on Spirivia daily and Albuterol. Pt states that she does believe the the inhalers are helping.     Referring provider: Burnard Bunting, MD  HPI:   77 y.o. woman whom we are seeing in hospital follow-up for acute respiratory failure requiring ventilator, history of COPD.  H&P reviewed.  Discharge summary reviewed.  Most recent pulmonary notes from Dr. Lamonte Sakai ranging from 2018 to 2019 x 5 reviewed.  Patient with history of COPD based on PFTs in the past.  2017.  Reviewed and interpreted as below.  Lasting in pulmonary clinic 2019.  Had been doing quite well.  On Spiriva monotherapy.  No exacerbations.  Dyspnea stable.  No major issue with cough etc.  Recently, she was in her closet and acutely developed what sounds like bronchospasm.  Inability to talk or breathe.  Was transported to the hospital.  Via EMS.  Upon arrival given concern for work of breathing she was intubated.  She spent the night in the ICU.  Blood gas reveals no significant hypoxemia.  Chest x-ray on my review interpretation is clear.  She will for CTA PE protocol given relatively unexplained and severe acute nature of the event.  This on my review and interpretation reveals no PE, clear lungs bilaterally, no sign of edema infiltrate infection etc.  The next morning she was extubated.  Less than 24 hours.  Shortly thereafter she was weaned to room air.  She was treated for asthma/COPD exacerbation with steroids, bronchodilators.  Discharged home.  Since then she is enrolling in physical therapy at home.  Getting stronger.  Breathing seems back to baseline.  She states her Spiriva  HandiHaler is no longer covered via insurance and would like an alternative.  Questionaires / Pulmonary Flowsheets:   ACT:      No data to display          MMRC:     No data to display          [place]:      No data to display          Tests:   FENO:  No results found for: "NITRICOXIDE"  PFT:    Latest Ref Rng & Units 07/14/2016    8:44 AM  PFT Results  FVC-Pre L 0.77   FVC-Predicted Pre % 34   FVC-Post L 0.86   FVC-Predicted Post % 38   Pre FEV1/FVC % % 67   Post FEV1/FCV % % 75   FEV1-Pre L 0.52   FEV1-Predicted Pre % 30   FEV1-Post L 0.64   Personally reviewed and interpreted as severe fixed obstruction without bronchodilator response  WALK:     11/05/2016   12:08 PM  SIX MIN WALK  Supplimental Oxygen during Test? (L/min) No    Imaging: Personally reviewed and as per EMR discussion this note No results found.  Lab Results: Personally reviewed CBC    Component Value Date/Time   WBC 8.4 08/25/2016 0856   RBC 3.78 (L) 08/25/2016 0856   HGB 10.4 (L) 08/25/2016 0856   HGB 9.5 (L) 08/16/2016  1638   HCT 34.0 (L) 08/25/2016 0856   HCT 29.5 (L) 08/16/2016 1638   PLT 308 08/25/2016 0856   PLT 272 08/16/2016 1638   MCV 89.9 08/25/2016 0856   MCV 86 08/16/2016 1638   MCH 27.5 08/25/2016 0856   MCHC 30.6 08/25/2016 0856   RDW 19.8 (H) 08/25/2016 0856   RDW 19.0 (H) 08/16/2016 1638   LYMPHSABS 0.9 08/25/2016 0856   LYMPHSABS 1.1 08/16/2016 1638   MONOABS 0.3 08/25/2016 0856   EOSABS 0.1 08/25/2016 0856   EOSABS 0.2 08/16/2016 1638   BASOSABS 0.0 08/25/2016 0856   BASOSABS 0.0 08/16/2016 1638    BMET    Component Value Date/Time   NA 139 10/27/2016 1408   K 4.9 10/27/2016 1408   CL 100 10/27/2016 1408   CO2 22 10/27/2016 1408   GLUCOSE 85 10/27/2016 1408   GLUCOSE 116 (H) 08/27/2016 0505   BUN 30 (H) 10/27/2016 1408   CREATININE 1.57 (H) 10/27/2016 1408   CALCIUM 10.1 10/27/2016 1408   GFRNONAA 33 (L) 10/27/2016 1408   GFRAA 38  (L) 10/27/2016 1408    BNP    Component Value Date/Time   BNP 1,516.4 (H) 08/25/2016 0856    ProBNP No results found for: "PROBNP"  Specialty Problems       Pulmonary Problems   GOLD D very severe COPD by GOLD classification ([place])    Last Assessment & Plan:  First quit consult.  Encourage cessation      Chronic hypoxemic respiratory failure (HCC)    Oxygen discontinued after initiation of therapy for COPD, OSA      OSA and COPD overlap syndrome (HCC)    Allergies  Allergen Reactions   Ace Inhibitors Swelling   Lisinopril Swelling    Immunization History  Administered Date(s) Administered   Influenza Split 09/05/2007, 09/18/2009, 09/14/2011, 05/16/2012, 07/19/2013, 08/14/2015   Influenza, High Dose Seasonal PF 06/01/2017   Influenza,inj,Quad PF,6+ Mos 06/30/2016   Influenza-Unspecified 08/05/2003, 06/11/2010, 08/01/2014   Moderna Sars-Covid-2 Vaccination 09/27/2019, 10/25/2019   Pneumococcal Conjugate-13 08/01/2014   Pneumococcal Polysaccharide-23 06/13/2006, 05/20/2017   Tdap 05/11/2012    Past Medical History:  Diagnosis Date   Diabetes mellitus without complication ([place])    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   atenolol (TENORMIN) 50 MG tablet Take 50 mg by mouth daily.   atorvastatin (LIPITOR) 80 MG tablet Take 80 mg by mouth daily.   dorzolamide-timolol (COSOPT) 22.3-6.8 MG/ML ophthalmic solution Place 1 drop into both eyes 2 (two) times daily.   ferrous sulfate 325 (65 FE) MG tablet Take 325 mg  by mouth daily with breakfast.   furosemide (LASIX) 20 MG tablet Take 1 tablet (20 mg total) by mouth daily.   glucose blood test strip 1 each by Other route as needed for other (blood glucose). Use as instructed   losartan (COZAAR) 25 MG tablet Take 25 mg by mouth daily.   OVER THE COUNTER MEDICATION Take 1 capsule by mouth daily. "  Maxivision" eye vitamin   spironolactone (ALDACTONE) 25 MG tablet TAKE ONE TABLET BY MOUTH ONCE DAILY   Tiotropium Bromide Monohydrate (SPIRIVA RESPIMAT) 2.5 MCG/ACT AERS Inhale 2 puffs into the lungs daily.   [DISCONTINUED] PROAIR HFA 108 (90 Base) MCG/ACT inhaler INHALE ONE TO TWO PUFFS BY MOUTH EVERY 4 HOURS AS NEEDED FOR WHEEZING OR SHORTNESS OF BREATH   [DISCONTINUED] tiotropium (SPIRIVA) 18 MCG inhalation capsule Place 1 capsule (18 mcg total) into inhaler and inhale daily.   albuterol (PROAIR HFA) 108 (90 Base) MCG/ACT inhaler INHALE ONE TO TWO PUFFS BY MOUTH EVERY 4 HOURS AS NEEDED FOR WHEEZING OR SHORTNESS OF BREATH   No facility-administered encounter medications on file as of 11/05/2022.     Review of Systems  Review of Systems  No chest pain with exertion.  No orthopnea or PND.  Comprehensive review of systems otherwise negative. Physical Exam  BP 128/66 (BP Location: Left Arm, Patient Position: Sitting, Cuff Size: Normal)   Pulse 72   Ht '5\' 3"'$  (1.6 m)   Wt 151 lb (68.5 kg)   SpO2 92%   BMI 26.75 kg/m   Wt Readings from Last 5 Encounters:  11/05/22 151 lb (68.5 kg)  02/24/18 163 lb 6.4 oz (74.1 kg)  11/28/17 160 lb (72.6 kg)  06/01/17 155 lb (70.3 kg)  02/24/17 152 lb (68.9 kg)    BMI Readings from Last 5 Encounters:  11/05/22 26.75 kg/m  02/24/18 28.05 kg/m  11/28/17 27.46 kg/m  06/01/17 27.46 kg/m  02/24/17 26.93 kg/m     Physical Exam General: Sitting in chair, no acute distress Eyes: EOMI, no icterus Neck: Supple, no JVP  Pulmonary: Clear, normal work of breathing Cardiovascular: Warm, no edema Abdomen: Nondistended,  bowel sounds present MSK: No synovitis, no joint effusion Neuro: Normal gait, no weakness Psych: Normal mood, full affect   Assessment & Plan:   Acute respiratory failure with hypoxemia requiring ventilator support: Sounds like acute bronchospasm triggered by something in her closet with quick resolution and extubation within 24 hours and weaned to room air shortly thereafter.  pO2 on ABG was over 500.  Historically had been doing very well prior to this.  On room air, no concerns today.  COPD: Ratio 0.67 with FEV1 30% 2017.  Historically well-controlled on Spiriva.  Acute issue notwithstanding above.  Spiriva HandiHaler being discontinued or no longer covered by her insurance.  New prescription for Stiolto Respimat today.    Return in about 3 months (around 02/05/2023).  With Dr. Lamonte Sakai per patient request, previously followed with him in clinic last seen 2019.   Lanier Clam, MD 11/10/2022   This appointment required 60 minutes of patient care (this includes precharting, chart review, review of results, face-to-face care, etc.).

## 2023-04-18 ENCOUNTER — Other Ambulatory Visit: Payer: Self-pay | Admitting: Pulmonary Disease

## 2023-10-17 ENCOUNTER — Other Ambulatory Visit: Payer: Self-pay | Admitting: Pulmonary Disease

## 2024-01-12 ENCOUNTER — Telehealth: Payer: Self-pay

## 2024-01-12 ENCOUNTER — Other Ambulatory Visit: Payer: Self-pay | Admitting: Pulmonary Disease

## 2024-01-12 NOTE — Telephone Encounter (Signed)
 Lm for patient to schedule OV.  Appt needed for further refills.

## 2024-05-03 ENCOUNTER — Other Ambulatory Visit: Payer: Self-pay | Admitting: Pulmonary Disease

## 2024-07-25 ENCOUNTER — Ambulatory Visit: Admitting: Pulmonary Disease

## 2024-07-25 ENCOUNTER — Encounter: Payer: Self-pay | Admitting: Pulmonary Disease

## 2024-07-25 VITALS — BP 120/58 | HR 70 | Temp 97.5°F | Ht 64.0 in | Wt 138.4 lb

## 2024-07-25 DIAGNOSIS — J449 Chronic obstructive pulmonary disease, unspecified: Secondary | ICD-10-CM

## 2024-07-25 DIAGNOSIS — G4733 Obstructive sleep apnea (adult) (pediatric): Secondary | ICD-10-CM | POA: Diagnosis not present

## 2024-07-25 MED ORDER — SPIRIVA RESPIMAT 2.5 MCG/ACT IN AERS: 2.0000 | INHALATION_SPRAY | Freq: Every day | RESPIRATORY_TRACT | 12 refills | Status: AC

## 2024-07-25 NOTE — Progress Notes (Signed)
 @Patient  ID: Kathleen Becker, female    DOB: 04-10-1946, 78 y.o.   MRN: [id number]  Chief Complaint  Patient presents with   COPD    Doing well.  Needs refill on Spiriva .  Wearing CPAP nightly.    Referring provider: Dauna Lenn Dux, MD  HPI:   78 y.o. woman whom we are seeing in follow up for COPD.  Most recent PCP note reviewed.  Most recent nephrology note reviewed.  Returns for routine follow-up.  Overall doing well.  No significant exacerbations in the interim.  Presented to the ED 05/2023 with mild exacerbation.  Wheezing shortness of breath.  Treated with nebulizers.  No steroids discharged with.  Sounds like her prior living situation, home was torn down due to issues with the best this.  Has been living with daughter locally.  Since then, breathing seems improved.  She thinks a lot of her issues related to her living environment.  Asbestos or other issues.  Uses Spiriva  Respimat 2 puff daily.  Doing well.  No significant concerns.  HPI at initial visit: Patient with history of COPD based on PFTs in the past.  2017.  Reviewed and interpreted as below.  Lasting in pulmonary clinic 2019.  Had been doing quite well.  On Spiriva  monotherapy.  No exacerbations.  Dyspnea stable.  No major issue with cough etc.  Recently, she was in her closet and acutely developed what sounds like bronchospasm.  Inability to talk or breathe.  Was transported to the hospital.  Via EMS.  Upon arrival given concern for work of breathing she was intubated.  She spent the night in the ICU.  Blood gas reveals no significant hypoxemia.  Chest x-ray on my review interpretation is clear.  She will for CTA PE protocol given relatively unexplained and severe acute nature of the event.  This on my review and interpretation reveals no PE, clear lungs bilaterally, no sign of edema infiltrate infection etc.  The next morning she was extubated.  Less than 24 hours.  Shortly thereafter she was weaned to room air.  She was  treated for asthma/COPD exacerbation with steroids, bronchodilators.  Discharged home.  Since then she is enrolling in physical therapy at home.  Getting stronger.  Breathing seems back to baseline.  She states her Spiriva  HandiHaler is no longer covered via insurance and would like an alternative.  Questionaires / Pulmonary Flowsheets:   ACT:      No data to display          MMRC:     No data to display          Epworth:      No data to display          Tests:   FENO:  No results found for: NITRICOXIDE  PFT:    Latest Ref Rng & Units 07/14/2016    8:44 AM  PFT Results  FVC-Pre L 0.77   FVC-Predicted Pre % 34   FVC-Post L 0.86   FVC-Predicted Post % 38   Pre FEV1/FVC % % 67   Post FEV1/FCV % % 75   FEV1-Pre L 0.52   FEV1-Predicted Pre % 30   FEV1-Post L 0.64   Personally reviewed and interpreted as severe fixed obstruction without bronchodilator response  WALK:     11/05/2016   12:08 PM  SIX MIN WALK  Supplimental Oxygen during Test? (L/min) No    Imaging: Personally reviewed and as per EMR discussion this note No results  found.  Lab Results: Personally reviewed CBC    Component Value Date/Time   WBC 8.4 08/25/2016 0856   RBC 3.78 (L) 08/25/2016 0856   HGB 10.4 (L) 08/25/2016 0856   HGB 9.5 (L) 08/16/2016 1638   HCT 34.0 (L) 08/25/2016 0856   HCT 29.5 (L) 08/16/2016 1638   PLT 308 08/25/2016 0856   PLT 272 08/16/2016 1638   MCV 89.9 08/25/2016 0856   MCV 86 08/16/2016 1638   MCH 27.5 08/25/2016 0856   MCHC 30.6 08/25/2016 0856   RDW 19.8 (H) 08/25/2016 0856   RDW 19.0 (H) 08/16/2016 1638   LYMPHSABS 0.9 08/25/2016 0856   LYMPHSABS 1.1 08/16/2016 1638   MONOABS 0.3 08/25/2016 0856   EOSABS 0.1 08/25/2016 0856   EOSABS 0.2 08/16/2016 1638   BASOSABS 0.0 08/25/2016 0856   BASOSABS 0.0 08/16/2016 1638    BMET    Component Value Date/Time   NA 139 10/27/2016 1408   K 4.9 10/27/2016 1408   CL 100 10/27/2016 1408   CO2 22  10/27/2016 1408   GLUCOSE 85 10/27/2016 1408   GLUCOSE 116 (H) 08/27/2016 0505   BUN 30 (H) 10/27/2016 1408   CREATININE 1.57 (H) 10/27/2016 1408   CALCIUM  10.1 10/27/2016 1408   GFRNONAA 33 (L) 10/27/2016 1408   GFRAA 38 (L) 10/27/2016 1408    BNP    Component Value Date/Time   BNP 1,516.4 (H) 08/25/2016 0856    ProBNP No results found for: PROBNP  Specialty Problems       Pulmonary Problems   GOLD D very severe COPD by GOLD classification (HCC)   Last Assessment & Plan:  First quit consult.  Encourage cessation      Chronic hypoxemic respiratory failure (HCC)   Oxygen discontinued after initiation of therapy for COPD, OSA      OSA and COPD overlap syndrome (HCC)    Allergies  Allergen Reactions   Ace Inhibitors Swelling   Lisinopril Swelling    Immunization History  Administered Date(s) Administered   INFLUENZA, HIGH DOSE SEASONAL PF 06/01/2017   Influenza Split 09/05/2007, 09/18/2009, 09/14/2011, 05/16/2012, 07/19/2013, 08/14/2015   Influenza,inj,Quad PF,6+ Mos 06/30/2016   Influenza-Unspecified 08/05/2003, 06/11/2010, 08/01/2014   Moderna Covid-19 Fall Seasonal Vaccine 40yrs & older 08/04/2022   Moderna Sars-Covid-2 Vaccination 09/27/2019, 10/25/2019   Pneumococcal Conjugate-13 08/01/2014   Pneumococcal Polysaccharide-23 06/13/2006, 05/20/2017   Tdap 05/11/2012    Past Medical History:  Diagnosis Date   Diabetes mellitus without complication (HCC)    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   High cholesterol    Hypertension     Tobacco History: Social History   Tobacco Use  Smoking Status Former   Current packs/day: 0.00   Average packs/day: 0.3 packs/day for 20.0 years (5.0 ttl pk-yrs)   Types: Cigarettes   Start date: 08/31/1975   Quit date: 08/31/1995   Years since quitting: 28.9  Smokeless Tobacco Never   Counseling given: Not Answered   Continue to not smoke  Outpatient Encounter Medications as of 07/25/2024  Medication Sig    albuterol  (VENTOLIN  HFA) 108 (90 Base) MCG/ACT inhaler INHALE 1 TO 2 PUFFS BY MOUTH EVERY 4 HOURS AS NEEDED FOR WHEEZING OR SHORTNESS OF BREATH AS DIRECTED   amLODipine  (NORVASC ) 10 MG tablet Take 10 mg by mouth daily.   atenolol  (TENORMIN ) 50 MG tablet Take 50 mg by mouth daily.   atorvastatin  (LIPITOR ) 80 MG tablet Take 80 mg by mouth daily.   dorzolamide -timolol  (COSOPT ) 22.3-6.8 MG/ML ophthalmic solution Place 1  drop into both eyes 2 (two) times daily.   ferrous sulfate 325 (65 FE) MG tablet Take 325 mg by mouth daily with breakfast.   furosemide  (LASIX ) 20 MG tablet Take 1 tablet (20 mg total) by mouth daily.   glucose blood test strip 1 each by Other route as needed for other (blood glucose). Use as instructed   losartan (COZAAR) 25 MG tablet Take 25 mg by mouth daily.   OVER THE COUNTER MEDICATION Take 1 capsule by mouth daily. Maxivision eye vitamin   spironolactone  (ALDACTONE ) 25 MG tablet TAKE ONE TABLET BY MOUTH ONCE DAILY   [DISCONTINUED] Tiotropium Bromide  Monohydrate (SPIRIVA  RESPIMAT) 2.5 MCG/ACT AERS INHALE 2 PUFFS BY MOUTH DAILY AS DIRECTED   Tiotropium Bromide  (SPIRIVA  RESPIMAT) 2.5 MCG/ACT AERS Inhale 2 puffs into the lungs daily.   No facility-administered encounter medications on file as of 07/25/2024.     Review of Systems  Review of Systems  N/a Physical Exam  BP (!) 120/58   Pulse 70   Temp (!) 97.5 F (36.4 C) (Oral)   Ht 5' 4 (1.626 m)   Wt 138 lb 6.4 oz (62.8 kg)   SpO2 100% Comment: room air  BMI 23.76 kg/m   Wt Readings from Last 5 Encounters:  07/25/24 138 lb 6.4 oz (62.8 kg)  11/05/22 151 lb (68.5 kg)  02/24/18 163 lb 6.4 oz (74.1 kg)  11/28/17 160 lb (72.6 kg)  06/01/17 155 lb (70.3 kg)    BMI Readings from Last 5 Encounters:  07/25/24 23.76 kg/m  11/05/22 26.75 kg/m  02/24/18 28.05 kg/m  11/28/17 27.46 kg/m  06/01/17 27.46 kg/m     Physical Exam General: Sitting up, no distress Eyes: No icterus Neck: No JVP sitting  upright Pulmonary: Clear, normal work of breathing, good air excursion Cardiovascular: Regular rate and rhythm, no murmur appreciated Abdomen: Nondistended   Assessment & Plan:   OSA on CPAP: Managed elsewhere. Reports regular CPAP use.   COPD: Ratio 0.67 with FEV1 30% 2017.  Historically well-controlled on Spiriva .  Switched to respimat 2024 as insurance stopped curator.  Symptoms well-controlled on current Spiriva  Respimat.  Refilled today.  Symptoms also improved with change in environment, living situation.   Return in about 1 year (around 07/25/2025) for f/u Dr. Annella.     Donnice JONELLE Annella, MD 07/25/2024

## 2024-09-11 ENCOUNTER — Other Ambulatory Visit: Payer: Self-pay | Admitting: Pulmonary Disease
